# Patient Record
Sex: Female | Born: 1975 | Race: White | Hispanic: No | Marital: Married | Smoking: Never smoker
Health system: Southern US, Community
[De-identification: ages and names within clinical notes are randomized; demographics above are authoritative.]

## PROBLEM LIST (undated history)

## (undated) DIAGNOSIS — R87619 Unspecified abnormal cytological findings in specimens from cervix uteri: Secondary | ICD-10-CM

## (undated) HISTORY — DX: Unspecified abnormal cytological findings in specimens from cervix uteri: R87.619

## (undated) HISTORY — PX: NO PAST SURGERIES: SHX2092

---

## 2001-02-16 ENCOUNTER — Other Ambulatory Visit: Admission: RE | Admit: 2001-02-16 | Discharge: 2001-02-16 | Payer: Self-pay | Admitting: Family Medicine

## 2007-11-26 ENCOUNTER — Encounter: Payer: Self-pay | Admitting: Sports Medicine

## 2007-12-10 ENCOUNTER — Encounter: Payer: Self-pay | Admitting: Sports Medicine

## 2009-07-31 ENCOUNTER — Ambulatory Visit: Payer: Self-pay | Admitting: General Practice

## 2010-10-30 DIAGNOSIS — R87619 Unspecified abnormal cytological findings in specimens from cervix uteri: Secondary | ICD-10-CM

## 2010-10-30 HISTORY — DX: Unspecified abnormal cytological findings in specimens from cervix uteri: R87.619

## 2014-04-12 ENCOUNTER — Ambulatory Visit: Payer: Self-pay | Admitting: General Practice

## 2014-04-19 ENCOUNTER — Ambulatory Visit: Payer: Self-pay | Admitting: General Practice

## 2016-05-16 ENCOUNTER — Ambulatory Visit (INDEPENDENT_AMBULATORY_CARE_PROVIDER_SITE_OTHER): Payer: BLUE CROSS/BLUE SHIELD | Admitting: Certified Nurse Midwife

## 2016-05-16 ENCOUNTER — Encounter: Payer: Self-pay | Admitting: Certified Nurse Midwife

## 2016-05-16 VITALS — BP 110/70 | HR 70 | Ht 64.5 in | Wt 176.0 lb

## 2016-05-16 DIAGNOSIS — Z01419 Encounter for gynecological examination (general) (routine) without abnormal findings: Secondary | ICD-10-CM | POA: Diagnosis not present

## 2016-05-16 DIAGNOSIS — Z309 Encounter for contraceptive management, unspecified: Secondary | ICD-10-CM | POA: Insufficient documentation

## 2016-05-16 DIAGNOSIS — Z124 Encounter for screening for malignant neoplasm of cervix: Secondary | ICD-10-CM

## 2016-05-16 DIAGNOSIS — Z3041 Encounter for surveillance of contraceptive pills: Secondary | ICD-10-CM

## 2016-05-16 MED ORDER — LEVONORGESTREL-ETHINYL ESTRAD 0.15-30 MG-MCG PO TABS: 1.0000 | ORAL_TABLET | Freq: Every day | ORAL | 3 refills | Status: DC

## 2016-05-16 NOTE — Progress Notes (Addendum)
Gynecology Annual Exam  PCP: No PCP Per Patient  Chief Complaint: Annual exam  History of Present Illness: Patient is a 41 y.o. [id number] presents for annual exam. The patient has no significant gyn complaints today.  At her last annual she had an elevated blood pressure reading on Sprintec and was switched to [place]ia. Repeat blood pressure on Portia was borderline (diastolic 88).  Her menses are every month, lasting 4 days, with a light flow. She occ has back pain with her menses and takes ibuprofen with relief. Her LMP was 04/25/2016. Since her last annual exam on 02/24/2015, she has changed jobs, is more sedentary at work, and has gained 17#. The patient is sexually active. She currently uses Oral contraceptives: Portia for contraception.  Her last Pap smear was 02/24/2015 and was NIL. 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  The patient has regular exercise: yes. She does get adequate calcium in her diet  She does not do monthly self breast exams. Has not had a recent mammogram and is eligible. There is a family history of breast cancer in her paternal grandmother 50(60s). Genetic testing has not been done She drinks alcohol socially. She does not smoke.   Review of Systems: Review of Systems  Constitutional: Negative for chills, fever and weight loss.       Positive for weight gain  HENT: Negative for congestion, sinus pain and sore throat.   Eyes: Negative for blurred vision and pain.  Respiratory: Negative for hemoptysis, shortness of breath and wheezing.   Cardiovascular: Negative for chest pain, palpitations and leg swelling.  Gastrointestinal: Negative for abdominal pain, blood in stool, diarrhea, heartburn, nausea and vomiting.  Genitourinary: Negative for dysuria, frequency, hematuria and urgency.       Negative for irregular menses and menorrhagia  Musculoskeletal: Negative for back pain, joint pain and myalgias.  Skin: Negative for itching and rash.   Neurological: Negative for dizziness, tingling and headaches.  Endo/Heme/Allergies: Negative for environmental allergies and polydipsia. Does not bruise/bleed easily.       Negative for hirsutism   Psychiatric/Behavioral: Negative for depression. The patient is not nervous/anxious and does not have insomnia.      Past Medical History:  Past Medical History:  Diagnosis Date  . Abnormal Pap smear of cervix 10/30/2010   +HPV, pap on 12/10/11 neg/neg; pap 12/2012 neg/neg    Past Surgical History:  History reviewed. No pertinent surgical history.  Medications: Prior to Admission medications   Medication Sig Start Date End Date Taking? Authorizing Provider  levonorgestrel-ethinyl estradiol (NORDETTE) 0.15-30 MG-MCG tablet Take 1 tablet by mouth daily.    Historical Provider, MD    Allergies:  No Known Allergies   Obstetric History: G2P2002 vaginal deliveries x 2  Social History:  Social History   Social History  . Marital status: Married    Spouse name: N/A  . Number of children: 2  . Years of education: N/A   Occupational History  . Admin assistant     [employer]   Social History Main Topics  . Smoking status: Never Smoker  . Smokeless tobacco: Never Used  . Alcohol use Yes  . Drug use: No  . Sexual activity: Yes    Birth control/ protection: Pill   Other Topics Concern  . Not on file   Social History Narrative  . No narrative on file    Family History:  Family History  Problem Relation Age of Onset  .  Hypertension Mother   . Diabetes Father   . Hypertension Father   . Diabetes Paternal Uncle   . Heart disease Maternal Grandmother   . Stroke Maternal Grandmother   . Bladder Cancer Maternal Grandfather   . Breast cancer Paternal Grandmother 46  . Hypertension Paternal Grandmother   . Stroke Paternal Grandmother      Physical Exam Vitals:  Vitals:   05/16/16 0816  BP: 110/70  Pulse: 70    Physical Exam  Constitutional: She is oriented  to person, place, and time and well-developed, well-nourished, and in no distress.  HENT:  Head: Normocephalic.  Eyes: Conjunctivae are normal.  Neck: Normal range of motion. No thyromegaly present.  Cardiovascular: Normal rate and regular rhythm.   No murmur heard. Pulmonary/Chest: Effort normal. She has no wheezes. She has no rales. She exhibits no tenderness.  Abdominal: Soft. She exhibits no distension and no mass. There is no tenderness. There is no guarding.  Genitourinary:  Genitourinary Comments: Vulva: no lesions or inflammation Vagina: no lesions, normal rugae Cervix: no lesions Uterus: RF, NSSC, mobile, NT Adnexa: NT, no masses   Musculoskeletal: She exhibits no edema.  Lymphadenopathy:    She has no cervical adenopathy.    She has no axillary adenopathy.       Right: No inguinal adenopathy present.       Left: No inguinal adenopathy present.  Neurological: She is alert and oriented to person, place, and time. Gait normal.  Skin: Skin is warm and dry. No rash noted.  Psychiatric: Mood, affect and judgment normal.   Breast: symmetrical, soft,  no skin changes, no nipple discharge, no masses. No infraclavicular or supraclavicular LAN  Assessment: 41 y.o. well woman exam  Plan:  1) Contraception management: refill Portia  2) Schedule screening mammogram. Recommend annual screening mammogram and monthly self breast exam  3) Pap-done  -4) Routine healthcare maintenance including cholesterol, diabetes screening done 02/24/15 was WNL.  5) Follow up 1 year for routine annual exam  Farrel Conners, CNM  05/27/2016 10:34 PM

## 2016-05-18 LAB — IGP, APTIMA HPV
HPV APTIMA: NEGATIVE
PAP Smear Comment: 0

## 2016-05-22 ENCOUNTER — Other Ambulatory Visit: Payer: Self-pay | Admitting: Certified Nurse Midwife

## 2016-05-22 DIAGNOSIS — Z1231 Encounter for screening mammogram for malignant neoplasm of breast: Secondary | ICD-10-CM

## 2016-05-24 ENCOUNTER — Ambulatory Visit
Admission: RE | Admit: 2016-05-24 | Discharge: 2016-05-24 | Disposition: A | Discharge status: Home / Self Care | Payer: BLUE CROSS/BLUE SHIELD | Source: Ambulatory Visit | Attending: Certified Nurse Midwife | Admitting: Certified Nurse Midwife

## 2016-05-24 DIAGNOSIS — Z1231 Encounter for screening mammogram for malignant neoplasm of breast: Secondary | ICD-10-CM | POA: Diagnosis not present

## 2016-05-27 ENCOUNTER — Encounter: Payer: Self-pay | Admitting: Certified Nurse Midwife

## 2017-07-01 ENCOUNTER — Ambulatory Visit (INDEPENDENT_AMBULATORY_CARE_PROVIDER_SITE_OTHER): Payer: 59 | Admitting: Certified Nurse Midwife

## 2017-07-01 ENCOUNTER — Encounter: Payer: Self-pay | Admitting: Certified Nurse Midwife

## 2017-07-01 VITALS — BP 120/76 | HR 80 | Ht 64.5 in | Wt 180.0 lb

## 2017-07-01 DIAGNOSIS — Z01419 Encounter for gynecological examination (general) (routine) without abnormal findings: Secondary | ICD-10-CM

## 2017-07-01 DIAGNOSIS — Z1239 Encounter for other screening for malignant neoplasm of breast: Secondary | ICD-10-CM

## 2017-07-01 DIAGNOSIS — Z124 Encounter for screening for malignant neoplasm of cervix: Secondary | ICD-10-CM | POA: Diagnosis not present

## 2017-07-01 DIAGNOSIS — Z0000 Encounter for general adult medical examination without abnormal findings: Secondary | ICD-10-CM

## 2017-07-01 DIAGNOSIS — Z1231 Encounter for screening mammogram for malignant neoplasm of breast: Secondary | ICD-10-CM | POA: Diagnosis not present

## 2017-07-01 NOTE — Progress Notes (Signed)
Gynecology Annual Exam  PCP: Diana Contreras, No Pcp Per  Chief Complaint: Annual exam  History of Present Illness: Diana Contreras is a 42 y.o. ZO  [id number] who presents for her annual exam. The Diana Contreras has no significant gyn complaints today.   Her menses are every month, lasting 2-3 days, with a light flow. She occ has back pain with her menses and takes ibuprofen with relief. Her LMP was 06/19/2017. Since her last annual exam on 05/16/2016, she has had no significant changes in her health. The Diana Contreras is sexually active. She currently uses Oral contraceptives: Portia for contraception.  Her last Pap smear was 02/24/2015 and was NIL. 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  The Diana Contreras has regular exercise: yes. She does get adequate calcium in her diet  She does not do monthly self breast exams. Her most recent mammogram was 05/24/2016 and was negative. There is a family history of breast cancer in her paternal grandmother (46s). Genetic testing has not been done She drinks alcohol once/week. . She does not smoke. Her most recent cholesterol screen was 02/24/2015 and was normal.    Review of Systems: Review of Systems  Constitutional: Negative for chills, fever and weight loss.       Positive for weight gain  HENT: Negative for congestion, sinus pain and sore throat.   Eyes: Negative for blurred vision and pain.  Respiratory: Negative for hemoptysis, shortness of breath and wheezing.   Cardiovascular: Negative for chest pain, palpitations and leg swelling.  Gastrointestinal: Negative for abdominal pain, blood in stool, diarrhea, heartburn, nausea and vomiting.  Genitourinary: Negative for dysuria, frequency, hematuria and urgency.       Negative for irregular menses and menorrhagia  Musculoskeletal: Negative for back pain, joint pain and myalgias.  Skin: Negative for itching and rash.  Neurological: Negative for dizziness, tingling and headaches.  Endo/Heme/Allergies:  Negative for environmental allergies and polydipsia. Does not bruise/bleed easily.       Negative for hirsutism   Psychiatric/Behavioral: Negative for depression. The Diana Contreras is not nervous/anxious and does not have insomnia.      Past Medical History:  Past Medical History:  Diagnosis Date  . Abnormal Pap smear of cervix 10/30/2010   +HPV, pap on 12/10/11 neg/neg; pap 12/2012 neg/neg    Past Surgical History:  Past Surgical History:  Procedure Laterality Date  . NO PAST SURGERIES      Medications: Prior to Admission medications   Medication Sig Start Date End Date Taking? Authorizing Provider  levonorgestrel-ethinyl estradiol (NORDETTE) 0.15-30 MG-MCG tablet Take 1 tablet by mouth daily.    Historical Provider, MD    Allergies:  No Known Allergies   Obstetric History: G2P2002 vaginal deliveries x 2  Social History:  Social History   Socioeconomic History  . Marital status: Married    Spouse name: Not on file  . Number of children: 2  . Years of education: Not on file  . Highest education level: Not on file  Occupational History  . Occupation: Advertising account executive    Comment: [employer]  Social Needs  . Financial resource strain: Not on file  . Food insecurity:    Worry: Not on file    Inability: Not on file  . Transportation needs:    Medical: Not on file    Non-medical: Not on file  Tobacco Use  . Smoking status: Never Smoker  . Smokeless tobacco: Never Used  Substance and Sexual Activity  . Alcohol  use: Yes  . Drug use: No  . Sexual activity: Yes    Birth control/protection: Pill  Lifestyle  . Physical activity:    Days per week: 0 days    Minutes per session: 0 min  . Stress: Not at all  Relationships  . Social connections:    Talks on phone: Not on file    Gets together: Not on file    Attends religious service: Not on file    Active member of club or organization: Not on file    Attends meetings of clubs or organizations: Not on file     Relationship status: Not on file  . Intimate partner violence:    Fear of current or ex partner: Not on file    Emotionally abused: Not on file    Physically abused: Not on file    Forced sexual activity: Not on file  Other Topics Concern  . Not on file  Social History Narrative  . Not on file    Family History:  Family History  Problem Relation Age of Onset  . Hypertension Mother   . Diabetes Father   . Hypertension Father   . Diabetes Paternal Uncle   . Heart disease Maternal Grandmother   . Stroke Maternal Grandmother   . Bladder Cancer Maternal Grandfather   . Breast cancer Paternal Grandmother [id number]  . Hypertension Paternal Grandmother   . Stroke Paternal Grandmother      Physical Exam Vitals: BP 120/76   Pulse 80   Ht 5' 4.5" (1.638 m)   Wt 180 lb (81.6 kg)   LMP 06/19/2017 (Exact Date)   BMI 30.42 kg/m  Physical Exam  Constitutional: She is oriented to person, place, and time and well-developed, well-nourished, and in no distress.  HENT:  Head: Normocephalic.  Eyes: Conjunctivae are normal.  Neck: Normal range of motion. No thyromegaly present.  Cardiovascular: Normal rate and regular rhythm.  No murmur heard. Pulmonary/Chest: Effort normal. She has no wheezes. She has no rales. She exhibits no tenderness.  Abdominal: Soft. She exhibits no distension and no mass. There is no tenderness. There is no guarding.  Genitourinary:  Genitourinary Comments: Vulva: no lesions or inflammation Vagina: no lesions, normal rugae Cervix: no lesions Uterus: RF, NSSC, mobile, NT Adnexa: NT, no masses   Musculoskeletal: She exhibits no edema.  Lymphadenopathy:    She has no cervical adenopathy.    She has no axillary adenopathy.       Right: No inguinal adenopathy present.       Left: No inguinal adenopathy present.  Neurological: She is alert and oriented to person, place, and time. Gait normal.  Skin: Skin is warm and dry. No rash noted.  Psychiatric: Mood, affect and  judgment normal.   Breast: symmetrical, soft,  no skin changes, no nipple discharge, no masses. No infraclavicular or supraclavicular LAN  Assessment: 42 y.o. well woman exam  Plan:  1) Contraception management: refill Portia  2) Schedule screening mammogram. Recommend annual screening mammogram and monthly self breast exam  3) Pap-done  -4) Routine healthcare maintenance including cholesterol, diabetes screening done 02/24/15 was WNL.  5) Follow up 1 year for routine annual exam  Farrel ConnersColleen Gayla Benn, CNM  07/03/2017 4:01 PM

## 2017-07-03 ENCOUNTER — Encounter: Payer: Self-pay | Admitting: Certified Nurse Midwife

## 2017-07-03 LAB — IGP,RFX APTIMA HPV ALL PTH: PAP Smear Comment: 0

## 2017-07-03 MED ORDER — LEVONORGESTREL-ETHINYL ESTRAD 0.15-30 MG-MCG PO TABS: 1.0000 | ORAL_TABLET | Freq: Every day | ORAL | 3 refills | Status: DC

## 2017-08-15 ENCOUNTER — Ambulatory Visit
Admission: RE | Admit: 2017-08-15 | Discharge: 2017-08-15 | Disposition: A | Discharge status: Home / Self Care | Payer: 59 | Source: Ambulatory Visit | Attending: Certified Nurse Midwife | Admitting: Certified Nurse Midwife

## 2017-08-15 DIAGNOSIS — Z1231 Encounter for screening mammogram for malignant neoplasm of breast: Secondary | ICD-10-CM | POA: Insufficient documentation

## 2017-08-15 DIAGNOSIS — Z1239 Encounter for other screening for malignant neoplasm of breast: Secondary | ICD-10-CM

## 2017-09-16 ENCOUNTER — Encounter: Payer: Self-pay | Admitting: Certified Nurse Midwife

## 2017-09-16 ENCOUNTER — Ambulatory Visit (INDEPENDENT_AMBULATORY_CARE_PROVIDER_SITE_OTHER): Payer: 59 | Admitting: Certified Nurse Midwife

## 2017-09-16 VITALS — BP 134/80 | HR 72 | Ht 64.0 in | Wt 179.0 lb

## 2017-09-16 DIAGNOSIS — N764 Abscess of vulva: Secondary | ICD-10-CM

## 2017-09-16 MED ORDER — SULFAMETHOXAZOLE-TRIMETHOPRIM 800-160 MG PO TABS: 1.0000 | ORAL_TABLET | Freq: Two times a day (BID) | ORAL | 0 refills | Status: AC

## 2017-09-16 NOTE — Progress Notes (Signed)
Obstetrics & Gynecology Office Visit   Chief Complaint:  Chief Complaint  Patient presents with  . Gynecologic Exam    found lump vaginal area    History of Present Illness: 42 year old WF, G2 P2, LMP 09/10/2017,  presents with complaints of a tender, marble sized lump on her left lower labia x 1 day. She started sitz baths with Epsom salts and it started feeling a little better today. Did not see any drainage on her underwear/ panty liner. No unusual discharge. Just recently had her annual exam in March 2019. She currently uses Estate agentortia for contraception   Review of Systems:  ROS   Past Medical History:  Past Medical History:  Diagnosis Date  . Abnormal Pap smear of cervix 10/30/2010   +HPV, pap on 12/10/11 neg/neg; pap 12/2012 neg/neg    Past Surgical History:  Past Surgical History:  Procedure Laterality Date  . NO PAST SURGERIES      Gynecologic History: Patient's last menstrual period was 09/10/2017.  Obstetric History: [id number]  Family History:  Family History  Problem Relation Age of Onset  . Hypertension Mother   . Diabetes Father   . Hypertension Father   . Diabetes Paternal Uncle   . Heart disease Maternal Grandmother        died from MI  . Stroke Maternal Grandmother   . Bladder Cancer Maternal Grandfather   . Breast cancer Paternal Grandmother [id number]  . Hypertension Paternal Grandmother   . Stroke Paternal Grandmother     Social History:  Social History   Socioeconomic History  . Marital status: Married    Spouse name: Not on file  . Number of children: 2  . Years of education: Not on file  . Highest education level: Not on file  Occupational History  . Occupation: Advertising account executiveAdmin assistant    Comment: [employer]  Social Needs  . Financial resource strain: Not on file  . Food insecurity:    Worry: Not on file    Inability: Not on file  . Transportation needs:    Medical: Not on file    Non-medical: Not on file  Tobacco Use  . Smoking status:  Never Smoker  . Smokeless tobacco: Never Used  Substance and Sexual Activity  . Alcohol use: Yes  . Drug use: No  . Sexual activity: Yes    Birth control/protection: Pill  Lifestyle  . Physical activity:    Days per week: 0 days    Minutes per session: 0 min  . Stress: Not at all  Relationships  . Social connections:    Talks on phone: Not on file    Gets together: Not on file    Attends religious service: Not on file    Active member of club or organization: Not on file    Attends meetings of clubs or organizations: Not on file    Relationship status: Not on file  . Intimate partner violence:    Fear of current or ex partner: Not on file    Emotionally abused: Not on file    Physically abused: Not on file    Forced sexual activity: Not on file  Other Topics Concern  . Not on file  Social History Narrative  . Not on file    Allergies:  No Known Allergies  Medications: Prior to Admission medications   Medication Sig Start Date End Date Taking? Authorizing Provider  acyclovir (ZOVIRAX) 400 MG tablet Take 400 mg by mouth 3 (three) times  daily. 07/29/17  Yes [provider]  levonorgestrel-ethinyl estradiol (NORDETTE) 0.15-30 MG-MCG tablet Take 1 tablet by mouth daily. 07/03/17  Yes Farrel Conners, CNM           Physical Exam Vitals: BP 134/80   Pulse 72   Ht 5\' 4"  (1.626 m)   Wt 179 lb (81.2 kg)   LMP 09/10/2017   BMI 30.73 kg/m  Physical Exam  Constitutional: She is oriented to person, place, and time. She appears well-developed and well-nourished. No distress.  Cardiovascular: Normal rate.  Respiratory: Effort normal.  Genitourinary:  Genitourinary Comments: Vulva: on lower left labia majora is a small inflamed maculopapular lesion with a central puncture wound. Scant clear discharge expressed from area. Shortened hairs in the area  Neurological: She is alert and oriented to person, place, and time.  Psychiatric: She has a normal mood and affect.    No inguinal lymphadenopathy   Assessment: 42 y.o. [id number] with a resolving boil  Plan: Continue sitz baths BID Bactrim DS BID x 5 days RTO prn persistent or worsening symptoms.  Farrel Conners, CNM

## 2017-10-13 ENCOUNTER — Encounter: Payer: Self-pay | Admitting: Emergency Medicine

## 2017-10-13 ENCOUNTER — Ambulatory Visit
Admission: EM | Admit: 2017-10-13 | Discharge: 2017-10-13 | Disposition: A | Discharge status: Home / Self Care | Payer: 59 | Attending: Family Medicine | Admitting: Family Medicine

## 2017-10-13 ENCOUNTER — Other Ambulatory Visit: Payer: Self-pay

## 2017-10-13 DIAGNOSIS — H6501 Acute serous otitis media, right ear: Secondary | ICD-10-CM | POA: Diagnosis not present

## 2017-10-13 MED ORDER — AMOXICILLIN 875 MG PO TABS: 875.0000 mg | ORAL_TABLET | Freq: Two times a day (BID) | ORAL | 0 refills | Status: DC

## 2017-10-13 NOTE — ED Triage Notes (Signed)
Patient c/o ear drainage to right ear x 2 weeks. Also reports ear fullness.

## 2017-10-13 NOTE — ED Provider Notes (Signed)
[facility]    CSN: [id number] Arrival date & time: 10/13/17  0800     History   Chief Complaint Chief Complaint  Patient presents with  . Ear Drainage    HPI Diana Contreras is a 42 y.o. female.   42 yo female with a c/o right ear pressure for 2 weeks. Denies any fevers, chills, injuries.   The history is provided by the patient.  Ear Drainage     Past Medical History:  Diagnosis Date  . Abnormal Pap smear of cervix 10/30/2010   +HPV, pap on 12/10/11 neg/neg; pap 12/2012 neg/neg    Patient Active Problem List   Diagnosis Date Noted  . Contraceptive management 05/16/2016    Past Surgical History:  Procedure Laterality Date  . NO PAST SURGERIES      OB History    Gravida  2   Para  2   Term  2   Preterm      AB      Living  2     SAB      TAB      Ectopic      Multiple      Live Births  2        Obstetric Comments  Pregnancy induced hypertension         Home Medications    Prior to Admission medications   Medication Sig Start Date End Date Taking? Authorizing Provider  acyclovir (ZOVIRAX) 400 MG tablet Take 400 mg by mouth 3 (three) times daily. 07/29/17  Yes [provider]  levonorgestrel-ethinyl estradiol (NORDETTE) 0.15-30 MG-MCG tablet Take 1 tablet by mouth daily. 07/03/17  Yes Farrel ConnersGutierrez, Colleen, CNM  amoxicillin (AMOXIL) 875 MG tablet Take 1 tablet (875 mg total) by mouth 2 (two) times daily. 10/13/17   Payton Mccallumonty, Evonda Enge, MD    Family History Family History  Problem Relation Age of Onset  . Hypertension Mother   . Diabetes Father   . Hypertension Father   . Diabetes Paternal Uncle   . Heart disease Maternal Grandmother        died from MI  . Stroke Maternal Grandmother   . Bladder Cancer Maternal Grandfather   . Breast cancer Paternal Grandmother [id number]  . Hypertension Paternal Grandmother   . Stroke Paternal Grandmother     Social History Social History   Tobacco Use  . Smoking status: Never Smoker   . Smokeless tobacco: Never Used  Substance Use Topics  . Alcohol use: Yes  . Drug use: No     Allergies   Patient has no known allergies.   Review of Systems Review of Systems   Physical Exam Triage Vital Signs ED Triage Vitals  Enc Vitals Group     BP 10/13/17 0812 (!) 141/106     Pulse Rate 10/13/17 0812 77     Resp 10/13/17 0812 18     Temp 10/13/17 0812 98.6 F (37 C)     Temp Source 10/13/17 0812 Oral     SpO2 10/13/17 0812 98 %     Weight 10/13/17 0810 175 lb (79.4 kg)     Height 10/13/17 0810 5\' 4"  (1.626 m)     Head Circumference --      Peak Flow --      Pain Score 10/13/17 0810 0     Pain Loc --      Pain Edu? --      Excl. in GC? --    No data found.  Updated Vital Signs BP (!) 141/106 (BP Location: Left Arm)   Pulse 77   Temp 98.6 F (37 C) (Oral)   Resp 18   Ht 5\' 4"  (1.626 m)   Wt 175 lb (79.4 kg)   LMP 10/10/2017 (Exact Date)   SpO2 98%   BMI 30.04 kg/m   Visual Acuity Right Eye Distance:   Left Eye Distance:   Bilateral Distance:    Right Eye Near:   Left Eye Near:    Bilateral Near:     Physical Exam  Constitutional: She appears well-developed and well-nourished. No distress.  HENT:  Right Ear: Tympanic membrane is erythematous and bulging. A middle ear effusion is present.  Left Ear: Tympanic membrane and ear canal normal.  Skin: She is not diaphoretic.  Nursing note and vitals reviewed.    UC Treatments / Results  Labs (all labs ordered are listed, but only abnormal results are displayed) Labs Reviewed - No data to display  EKG None  Radiology No results found.  Procedures Procedures (including critical care time)  Medications Ordered in UC Medications - No data to display  Initial Impression / Assessment and Plan / UC Course  I have reviewed the triage vital signs and the nursing notes.  Pertinent labs & imaging results that were available during my care of the patient were reviewed by me and considered in  my medical decision making (see chart for details).      Final Clinical Impressions(s) / UC Diagnoses   Final diagnoses:  Right acute serous otitis media, recurrence not specified    ED Prescriptions    Medication Sig Dispense Auth. Provider   amoxicillin (AMOXIL) 875 MG tablet Take 1 tablet (875 mg total) by mouth 2 (two) times daily. 20 tablet Payton Mccallum, MD     1. diagnosis reviewed with patient 2. rx as per orders above; reviewed possible side effects, interactions, risks and benefits  3. Recommend supportive treatment with otc analgesics prn 4. Follow-up prn if symptoms worsen or don't improve   Controlled Substance Prescriptions [place] Controlled Substance Registry consulted? Not Applicable   Payton Mccallum, MD 10/13/17 [phone]

## 2018-08-10 ENCOUNTER — Other Ambulatory Visit: Payer: Self-pay | Admitting: Certified Nurse Midwife

## 2018-08-17 ENCOUNTER — Other Ambulatory Visit: Payer: Self-pay | Admitting: Certified Nurse Midwife

## 2018-09-22 ENCOUNTER — Ambulatory Visit (INDEPENDENT_AMBULATORY_CARE_PROVIDER_SITE_OTHER): Payer: 59 | Admitting: Certified Nurse Midwife

## 2018-09-22 ENCOUNTER — Other Ambulatory Visit: Payer: Self-pay

## 2018-09-22 ENCOUNTER — Encounter: Payer: Self-pay | Admitting: Certified Nurse Midwife

## 2018-09-22 VITALS — BP 120/80 | Ht 64.0 in | Wt 191.0 lb

## 2018-09-22 DIAGNOSIS — R6889 Other general symptoms and signs: Secondary | ICD-10-CM

## 2018-09-22 DIAGNOSIS — Z01419 Encounter for gynecological examination (general) (routine) without abnormal findings: Secondary | ICD-10-CM | POA: Diagnosis not present

## 2018-09-22 DIAGNOSIS — Z13220 Encounter for screening for lipoid disorders: Secondary | ICD-10-CM

## 2018-09-22 DIAGNOSIS — Z131 Encounter for screening for diabetes mellitus: Secondary | ICD-10-CM

## 2018-09-22 DIAGNOSIS — R635 Abnormal weight gain: Secondary | ICD-10-CM

## 2018-09-22 DIAGNOSIS — Z1239 Encounter for other screening for malignant neoplasm of breast: Secondary | ICD-10-CM

## 2018-09-22 NOTE — Progress Notes (Signed)
Gynecology Annual Exam     PCP: Dalia Heading, CNM  Chief Complaint: Annual exam  History of Present Illness: Patient is a 43 y.o. WF  G2P2002 who presents for her annual exam. The patient has no significant gyn complaints today.   Her menses are every month, lasting 2-3 days, with a light flow. She occ has back pain with her menses and takes ibuprofen with relief. Her LMP was 09/16/2018. Since her last annual exam on 07/01/2017, she has gained 11#. Is having trouble losing weight. 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 Feels hot all the time. Would like thyroid function checked.  The patient is sexually active. She currently uses Oral contraceptives: Portia for contraception.  Her last Pap smear was 07/01/2017 and was NIL. 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  The patient has regular exercise: yes.by walking 2-4 times/week. She does get adequate calcium in her diet  She does not do monthly self breast exams. Her most recent mammogram was 08/15/2017 and was negative. There is a family history of breast cancer in her paternal grandmother (62s). Genetic testing has not been done She drinks a glass of 1-2 time//week. . She does not smoke. Her most recent cholesterol screen was 02/24/2015 and was normal.    Review of Systems: Review of Systems  Constitutional: Negative for chills, fever and weight loss.       Positive for weight gain  HENT: Negative for congestion, sinus pain and sore throat.   Eyes: Negative for blurred vision and pain.  Respiratory: Negative for hemoptysis, shortness of breath and wheezing.   Cardiovascular: Negative for chest pain, palpitations and leg swelling.  Gastrointestinal: Negative for abdominal pain, blood in stool, diarrhea, heartburn, nausea and vomiting.  Genitourinary: Negative for dysuria, frequency, hematuria and urgency.       Negative for irregular menses and menorrhagia  Musculoskeletal: Negative for back  pain, joint pain and myalgias.  Skin: Negative for itching and rash.  Neurological: Negative for dizziness, tingling and headaches.  Endo/Heme/Allergies: Negative for environmental allergies and polydipsia. Does not bruise/bleed easily.       Negative for hirsutism   Psychiatric/Behavioral: Negative for depression. The patient is not nervous/anxious and does not have insomnia.      Past Medical History:  Past Medical History:  Diagnosis Date  . Abnormal Pap smear of cervix 10/30/2010   +HPV, pap on 12/10/11 neg/neg; pap 12/2012 neg/neg    Past Surgical History:  Past Surgical History:  Procedure Laterality Date  . NO PAST SURGERIES      Medications:  Current Outpatient Medications:  .  acyclovir (ZOVIRAX) 400 MG tablet, Take 400 mg by mouth 3 (three) times daily. X 3-5 days Prn fever blisters, Disp: , Rfl:  .  LILLOW 0.15-30 MG-MCG tablet, TAKE 1 TABLET BY MOUTH EVERY DAY, Disp: 84 tablet, Rfl: 0  Allergies:  No Known Allergies   Obstetric History: G2P2002 vaginal deliveries x 2  Social History:  Social History   Socioeconomic History  . Marital status: Married    Spouse name: Not on file  . Number of children: 2  . Years of education: Not on file  . Highest education level: Not on file  Occupational History  . Occupation: Chiropractor    Comment: [employer]  Social Needs  . Financial resource strain: Not on file  . Food insecurity    Worry: Not on file    Inability:  Not on file  . Transportation needs    Medical: Not on file    Non-medical: Not on file  Tobacco Use  . Smoking status: Never Smoker  . Smokeless tobacco: Never Used  Substance and Sexual Activity  . Alcohol use: Yes  . Drug use: No  . Sexual activity: Yes    Birth control/protection: Pill  Lifestyle  . Physical activity    Days per week: 0 days    Minutes per session: 0 min  . Stress: Not at all  Relationships  . Social Musicianconnections    Talks on phone: Not on file    Gets  together: Not on file    Attends religious service: Not on file    Active member of club or organization: Not on file    Attends meetings of clubs or organizations: Not on file    Relationship status: Not on file  . Intimate partner violence    Fear of current or ex partner: Not on file    Emotionally abused: Not on file    Physically abused: Not on file    Forced sexual activity: Not on file  Other Topics Concern  . Not on file  Social History Narrative  . Not on file    Family History:  Family History  Problem Relation Age of Onset  . Hypertension Mother   . Diabetes Father   . Hypertension Father   . Diabetes Paternal Uncle   . Heart disease Maternal Grandmother        died from MI  . Stroke Maternal Grandmother   . Bladder Cancer Maternal Grandfather   . Breast cancer Paternal Grandmother [id number]  . Hypertension Paternal Grandmother   . Stroke Paternal Grandmother      Physical Exam Vitals: BP 120/80   Ht 5\' 4"  (1.626 m)   Wt 191 lb (86.6 kg)   LMP 09/16/2018   BMI 32.79 kg/m  Physical Exam  Constitutional: She is oriented to person, place, and time and well-developed, well-nourished, and in no distress.  HENT:  Head: Normocephalic.  Eyes: Conjunctivae are normal.  Neck: Normal range of motion. No thyromegaly present.  Cardiovascular: Normal rate and regular rhythm.  No murmur heard. Pulmonary/Chest: Effort normal. She has no wheezes. She has no rales. She exhibits no tenderness.  Abdominal: Soft. She exhibits no distension and no mass. There is no abdominal tenderness. There is no guarding.  Genitourinary:    Genitourinary Comments: Vulva: no lesions or inflammation Vagina: no lesions, normal rugae Cervix: no lesions Uterus: RF, NSSC, mobile, NT Adnexa: NT, no masses    Musculoskeletal:        General: No edema.  Lymphadenopathy:    She has no cervical adenopathy.    She has no axillary adenopathy.  Neurological: She is alert and oriented to person,  place, and time. Gait normal.  Skin: Skin is warm and dry. No rash noted.  Psychiatric: Mood, affect and judgment normal.   Breast: symmetrical, soft,  no skin changes, no nipple discharge, no masses. No infraclavicular or supraclavicular LAN  Assessment: 43 y.o. well woman exam  Plan:  1) Contraception management: refill Portia  2) Breast cancer screening: Schedule screening mammogram. Recommend annual screening mammogram and monthly self breast exam  3) Pap-done  -4) Routine healthcare maintenance including cholesterol, diabetes screening and TSH done today. Discussed calcium and vitamin D3 requirements. Encouraged stepping up exercise as well as eating healthy to decrease weight.   5) Follow up 1 year for  routine annual exam  Farrel Connersolleen Gaetano Romberger, CNM

## 2018-09-23 ENCOUNTER — Telehealth: Payer: Self-pay

## 2018-09-23 ENCOUNTER — Other Ambulatory Visit: Payer: Self-pay | Admitting: Maternal Newborn

## 2018-09-23 DIAGNOSIS — B001 Herpesviral vesicular dermatitis: Secondary | ICD-10-CM

## 2018-09-23 LAB — LIPID PANEL
Chol/HDL Ratio: 2.4 ratio (ref 0.0–4.4)
Cholesterol, Total: 175 mg/dL (ref 100–199)
HDL: 73 mg/dL (ref >39)
LDL Calculated: 82 mg/dL (ref 0–99)
Triglycerides: 98 mg/dL (ref 0–149)
VLDL Cholesterol Cal: 20 mg/dL (ref 5–40)

## 2018-09-23 LAB — TSH: TSH: 0.868 u[IU]/mL (ref 0.450–4.500)

## 2018-09-23 LAB — HEMOGLOBIN A1C
Est. average glucose Bld gHb Est-mCnc: 108 mg/dL
Hgb A1c MFr Bld: 5.4 % (ref 4.8–5.6)

## 2018-09-23 MED ORDER — ACYCLOVIR 400 MG PO TABS: 400.0000 mg | ORAL_TABLET | Freq: Three times a day (TID) | ORAL | 1 refills | Status: AC

## 2018-09-23 NOTE — Telephone Encounter (Signed)
Sent to pharmacy just now.

## 2018-09-23 NOTE — Progress Notes (Signed)
Refill for acyclovir at patient's request.

## 2018-09-23 NOTE — Telephone Encounter (Signed)
Pt aware.

## 2018-09-23 NOTE — Telephone Encounter (Signed)
Pt calling; was seen by CLG yesterday; refill of acyclovir was supposed to have been sent in; hsb went to p/u at [facility] on [address]; was told 'it wasn't available'.  Pt has fever blisters and needs this rx.  [phone]

## 2018-10-04 ENCOUNTER — Encounter: Payer: Self-pay | Admitting: Certified Nurse Midwife

## 2018-10-04 MED ORDER — LEVONORGESTREL-ETHINYL ESTRAD 0.15-30 MG-MCG PO TABS: 1.0000 | ORAL_TABLET | Freq: Every day | ORAL | 3 refills | Status: DC

## 2018-10-08 ENCOUNTER — Encounter: Payer: Self-pay | Admitting: Certified Nurse Midwife

## 2018-10-29 ENCOUNTER — Other Ambulatory Visit: Payer: Self-pay

## 2018-10-29 ENCOUNTER — Ambulatory Visit
Admission: RE | Admit: 2018-10-29 | Discharge: 2018-10-29 | Disposition: A | Discharge status: Home / Self Care | Payer: 59 | Source: Ambulatory Visit | Attending: Certified Nurse Midwife | Admitting: Certified Nurse Midwife

## 2018-10-29 DIAGNOSIS — Z1231 Encounter for screening mammogram for malignant neoplasm of breast: Secondary | ICD-10-CM | POA: Insufficient documentation

## 2018-10-29 DIAGNOSIS — Z1239 Encounter for other screening for malignant neoplasm of breast: Secondary | ICD-10-CM

## 2019-07-16 ENCOUNTER — Ambulatory Visit: Payer: 59 | Attending: Internal Medicine

## 2019-07-16 DIAGNOSIS — Z20822 Contact with and (suspected) exposure to covid-19: Secondary | ICD-10-CM

## 2019-07-17 LAB — NOVEL CORONAVIRUS, NAA: SARS-CoV-2, NAA: NOT DETECTED

## 2019-07-17 LAB — SARS-COV-2, NAA 2 DAY TAT

## 2019-11-08 ENCOUNTER — Telehealth: Payer: Self-pay

## 2019-11-08 ENCOUNTER — Other Ambulatory Visit: Payer: Self-pay

## 2019-11-08 MED ORDER — LEVONORGESTREL-ETHINYL ESTRAD 0.15-30 MG-MCG PO TABS: 1.0000 | ORAL_TABLET | Freq: Every day | ORAL | 3 refills | Status: DC

## 2019-12-14 ENCOUNTER — Other Ambulatory Visit (HOSPITAL_COMMUNITY)
Admission: RE | Admit: 2019-12-14 | Discharge: 2019-12-14 | Disposition: A | Discharge status: Home / Self Care | Payer: 59 | Source: Ambulatory Visit | Attending: Obstetrics | Admitting: Obstetrics

## 2019-12-14 ENCOUNTER — Other Ambulatory Visit: Payer: Self-pay

## 2019-12-14 ENCOUNTER — Encounter: Payer: Self-pay | Admitting: Obstetrics

## 2019-12-14 ENCOUNTER — Ambulatory Visit (INDEPENDENT_AMBULATORY_CARE_PROVIDER_SITE_OTHER): Payer: 59 | Admitting: Obstetrics

## 2019-12-14 VITALS — BP 130/90 | HR 66 | Ht 65.0 in | Wt 189.0 lb

## 2019-12-14 DIAGNOSIS — Z01419 Encounter for gynecological examination (general) (routine) without abnormal findings: Secondary | ICD-10-CM | POA: Diagnosis present

## 2019-12-14 DIAGNOSIS — Z23 Encounter for immunization: Secondary | ICD-10-CM

## 2019-12-14 MED ORDER — LEVONORGESTREL-ETHINYL ESTRAD 0.15-30 MG-MCG PO TABS: 1.0000 | ORAL_TABLET | Freq: Every day | ORAL | 3 refills | Status: DC

## 2019-12-14 NOTE — Progress Notes (Signed)
Gynecology Annual Exam  PCP: Pcp, No  Chief Complaint:  Chief Complaint  Patient presents with  . Gynecologic Exam    needs refill of acyclovir    History of Present Illness: Patient is a 44 y.o. [id number] presents for annual exam. The patient has no complaints today.   LMP: Patient's last menstrual period was 12/09/2019 (exact date). Average Interval: regular, 28 days Duration of flow: 4 days Heavy Menses: no Clots: no Intermenstrual Bleeding: no Postcoital Bleeding: no Dysmenorrhea: no   The patient is sexually active. She currently uses OCP (estrogen/progesterone) for contraception. She denies dyspareuHer paternal grandmother had breast CA at age 42.  The patient wears seatbelts: yes.   The patient has regular exercise: yes.    The patient denies current symptoms of depression.    Review of Systems: ROS  Past Medical History:  Patient Active Problem List   Diagnosis Date Noted  . Well woman exam with routine gynecological exam 05/16/2016    Past Surgical History:  Past Surgical History:  Procedure Laterality Date  . NO PAST SURGERIES      Gynecologic History:  Patient's last menstrual period was 12/09/2019 (exact date). Contraception: OCP (estrogen/progesterone) Last Pap: Results were: 2019 no abnormalities  Last mammogram: last year Results were: normal findings, negative for abnormal findings  Obstetric History: [id number]  Family History:  Family History  Problem Relation Age of Onset  . Hypertension Mother   . Diabetes Father   . Hypertension Father   . Diabetes Paternal Uncle   . Heart disease Maternal Grandmother        died from MI  . Stroke Maternal Grandmother   . Bladder Cancer Maternal Grandfather   . Breast cancer Paternal Grandmother 22  . Hypertension Paternal Grandmother   . Stroke Paternal Grandmother     Social History:  Social History   Socioeconomic History  . Marital status: Married    Spouse name: Not on file  . Number  of children: 2  . Years of education: Not on file  . Highest education level: Not on file  Occupational History  . Occupation: Automotive engineer    Comment: [employer]  Tobacco Use  . Smoking status: Never Smoker  . Smokeless tobacco: Never Used  Vaping Use  . Vaping Use: Never used  Substance and Sexual Activity  . Alcohol use: Yes  . Drug use: No  . Sexual activity: Yes    Birth control/protection: Pill  Other Topics Concern  . Not on file  Social History Narrative  . Not on file   Social Determinants of Health   Financial Resource Strain:   . Difficulty of Paying Living Expenses: Not on file  Food Insecurity:   . Worried About Programme researcher, broadcasting/film/video in the Last Year: Not on file  . Ran Out of Food in the Last Year: Not on file  Transportation Needs:   . Lack of Transportation (Medical): Not on file  . Lack of Transportation (Non-Medical): Not on file  Physical Activity:   . Days of Exercise per Week: Not on file  . Minutes of Exercise per Session: Not on file  Stress:   . Feeling of Stress : Not on file  Social Connections:   . Frequency of Communication with Friends and Family: Not on file  . Frequency of Social Gatherings with Friends and Family: Not on file  . Attends Religious Services: Not on file  . Active Member of Clubs or Organizations: Not on  file  . Attends Banker Meetings: Not on file  . Marital Status: Not on file  Intimate Partner Violence:   . Fear of Current or Ex-Partner: Not on file  . Emotionally Abused: Not on file  . Physically Abused: Not on file  . Sexually Abused: Not on file    Allergies:  No Known Allergies  Medications: Prior to Admission medications   Medication Sig Start Date End Date Taking? Authorizing Provider  acyclovir (ZOVIRAX) 400 MG tablet Take 400 mg by mouth 3 (three) times daily. X 3-5 days Prn fever blisters   Yes [provider]  levonorgestrel-ethinyl estradiol (LILLOW) 0.15-30 MG-MCG  tablet Take 1 tablet by mouth daily. 11/08/19  Yes Farrel Conners, CNM    Physical Exam Vitals: Blood pressure 130/90, pulse 66, height 5\' 5"  (1.651 m), weight 189 lb (85.7 kg), last menstrual period 12/09/2019.  General: NAD HEENT: normocephalic, anicteric Thyroid: no enlargement, no palpable nodules Pulmonary: No increased work of breathing, CTAB Cardiovascular: RRR, distal pulses 2+ Breast: Breast symmetrical, no tenderness, no palpable nodules or masses, no skin or nipple retraction present, no nipple discharge.  No axillary or supraclavicular lymphadenopathy. Abdomen: NABS, soft, non-tender, non-distended.  Umbilicus without lesions.  No hepatomegaly, splenomegaly or masses palpable. No evidence of hernia  Genitourinary:  External: Normal external female genitalia.  Normal urethral meatus, normal Bartholin's and Skene's glands.    Vagina: Normal vaginal mucosa, no evidence of prolapse.    Cervix: Grossly normal in appearance, no bleeding  Uterus: Non-enlarged, mobile, normal contour.  No CMT  Adnexa: ovaries non-enlarged, no adnexal masses  Rectal: deferred  Lymphatic: no evidence of inguinal lymphadenopathy Extremities: no edema, erythema, or tenderness Neurologic: Grossly intact Psychiatric: mood appropriate, affect full  Female chaperone present for pelvic and breast  portions of the physical exam    Assessment: 44 y.o. G2P2002 routine annual exam  Plan: Problem List Items Addressed This Visit    None    Visit Diagnoses    Need for diphtheria-tetanus-pertussis (Tdap) vaccine    -  Primary   Relevant Orders   Tdap vaccine greater than or equal to 7yo IM (Completed)   Women's annual routine gynecological examination       Relevant Orders   Cytology - PAP   CBC With Differential   Comprehensive metabolic panel   TSH   25-Hydroxy vitamin D Lcms D2+D3      1) Mammogram - recommend yearly screening mammogram.  Mammogram Was ordered today   2) STI screening   wasoffered and declined  3) ASCCP guidelines and rational discussed.  Patient opts for every 3 years screening interval  4) Contraception - the patient is currently using  OCP (estrogen/progesterone).  She is happy with her current form of contraception and plans to continue  5) Colonoscopy -- Screening recommended starting at age 73 for average risk individuals, age 60 for individuals deemed at increased risk (including African Americans) and recommended to continue until age 38.  For patient age 86-85 individualized approach is recommended.  Gold standard screening is via colonoscopy, Cologuard screening is an acceptable alternative for patient unwilling or unable to undergo colonoscopy.  "Colorectal cancer screening for average?risk adults: 2018 guideline update from the American Cancer Society"CA: A Cancer Journal for Clinicians: Aug 07, 2016   6) Routine healthcare maintenance including cholesterol, diabetes screening discussed Ordered today  7) Return in about 1 year (around 12/13/2020) for annual.   She received a tdap today.    02/12/2021,  CNM  12/14/2019 5:35 PM   [facility], [facility] 12/14/2019, 5:33 PM

## 2019-12-16 LAB — CYTOLOGY - PAP
Comment: NEGATIVE
Diagnosis: NEGATIVE
High risk HPV: NEGATIVE

## 2019-12-17 ENCOUNTER — Encounter: Payer: Self-pay | Admitting: Obstetrics

## 2019-12-22 LAB — COMPREHENSIVE METABOLIC PANEL
ALT: 17 IU/L (ref 0–32)
AST: 16 IU/L (ref 0–40)
Albumin/Globulin Ratio: 1.4 (ref 1.2–2.2)
Albumin: 4.1 g/dL (ref 3.8–4.8)
Alkaline Phosphatase: 100 IU/L (ref 44–121)
BUN/Creatinine Ratio: 13 (ref 9–23)
BUN: 12 mg/dL (ref 6–24)
Bilirubin Total: 0.2 mg/dL (ref 0.0–1.2)
CO2: 26 mmol/L (ref 20–29)
Calcium: 9.2 mg/dL (ref 8.7–10.2)
Chloride: 103 mmol/L (ref 96–106)
Creatinine, Ser: 0.95 mg/dL (ref 0.57–1.00)
GFR calc Af Amer: 85 mL/min/{1.73_m2} (ref >59)
GFR calc non Af Amer: 74 mL/min/{1.73_m2} (ref >59)
Globulin, Total: 2.9 g/dL (ref 1.5–4.5)
Glucose: 102 mg/dL — ABNORMAL HIGH (ref 65–99)
Potassium: 4.2 mmol/L (ref 3.5–5.2)
Sodium: 141 mmol/L (ref 134–144)
Total Protein: 7 g/dL (ref 6.0–8.5)

## 2019-12-22 LAB — CBC WITH DIFFERENTIAL
Basophils Absolute: 0.1 10*3/uL (ref 0.0–0.2)
Basos: 1 %
EOS (ABSOLUTE): 0.1 10*3/uL (ref 0.0–0.4)
Eos: 1 %
Hematocrit: 40.7 % (ref 34.0–46.6)
Hemoglobin: 13.1 g/dL (ref 11.1–15.9)
Immature Grans (Abs): 0 10*3/uL (ref 0.0–0.1)
Immature Granulocytes: 0 %
Lymphocytes Absolute: 1.8 10*3/uL (ref 0.7–3.1)
Lymphs: 24 %
MCH: 28 pg (ref 26.6–33.0)
MCHC: 32.2 g/dL (ref 31.5–35.7)
MCV: 87 fL (ref 79–97)
Monocytes Absolute: 0.4 10*3/uL (ref 0.1–0.9)
Monocytes: 6 %
Neutrophils Absolute: 5.3 10*3/uL (ref 1.4–7.0)
Neutrophils: 68 %
RBC: 4.68 x10E6/uL (ref 3.77–5.28)
RDW: 14.8 % (ref 11.7–15.4)
WBC: 7.7 10*3/uL (ref 3.4–10.8)

## 2019-12-22 LAB — 25-HYDROXY VITAMIN D LCMS D2+D3
25-Hydroxy, Vitamin D-2: <1 ng/mL
25-Hydroxy, Vitamin D-3: 43 ng/mL
25-Hydroxy, Vitamin D: 43 ng/mL

## 2019-12-22 LAB — TSH: TSH: 0.816 u[IU]/mL (ref 0.450–4.500)

## 2020-03-08 ENCOUNTER — Other Ambulatory Visit: Payer: Self-pay | Admitting: Obstetrics

## 2020-03-08 DIAGNOSIS — Z1231 Encounter for screening mammogram for malignant neoplasm of breast: Secondary | ICD-10-CM

## 2020-03-27 ENCOUNTER — Ambulatory Visit: Payer: 59

## 2020-04-03 ENCOUNTER — Other Ambulatory Visit: Payer: Self-pay

## 2020-04-03 ENCOUNTER — Ambulatory Visit
Admission: RE | Admit: 2020-04-03 | Discharge: 2020-04-03 | Disposition: A | Discharge status: Home / Self Care | Payer: 59 | Source: Ambulatory Visit | Attending: Obstetrics | Admitting: Obstetrics

## 2020-04-03 DIAGNOSIS — Z1231 Encounter for screening mammogram for malignant neoplasm of breast: Secondary | ICD-10-CM | POA: Diagnosis present

## 2020-12-20 ENCOUNTER — Ambulatory Visit: Payer: 59 | Admitting: Obstetrics and Gynecology

## 2020-12-22 ENCOUNTER — Other Ambulatory Visit: Payer: Self-pay

## 2020-12-22 ENCOUNTER — Ambulatory Visit: Payer: 59 | Admitting: Obstetrics

## 2020-12-22 ENCOUNTER — Ambulatory Visit (INDEPENDENT_AMBULATORY_CARE_PROVIDER_SITE_OTHER): Payer: 59 | Admitting: Obstetrics

## 2020-12-22 ENCOUNTER — Encounter: Payer: Self-pay | Admitting: Obstetrics

## 2020-12-22 VITALS — BP 122/74 | Ht 64.0 in | Wt 195.6 lb

## 2020-12-22 DIAGNOSIS — B372 Candidiasis of skin and nail: Secondary | ICD-10-CM | POA: Diagnosis not present

## 2020-12-22 DIAGNOSIS — Z1231 Encounter for screening mammogram for malignant neoplasm of breast: Secondary | ICD-10-CM | POA: Diagnosis not present

## 2020-12-22 DIAGNOSIS — Z01419 Encounter for gynecological examination (general) (routine) without abnormal findings: Secondary | ICD-10-CM

## 2020-12-22 MED ORDER — NYSTATIN 100000 UNIT/GM EX CREA: 1.0000 "application " | TOPICAL_CREAM | Freq: Two times a day (BID) | CUTANEOUS | 1 refills | Status: DC

## 2020-12-22 MED ORDER — LEVONORGESTREL-ETHINYL ESTRAD 0.15-30 MG-MCG PO TABS: 1.0000 | ORAL_TABLET | Freq: Every day | ORAL | 3 refills | Status: DC

## 2020-12-22 NOTE — Progress Notes (Signed)
Gynecology Annual Exam  PCP: Diana Contreras, No Pcp Per (Inactive)  Chief Complaint:  Chief Complaint  Diana Contreras presents with   Annual Exam    History of Present Illness: Diana Contreras is a 45 y.o. K3T4656 presents for annual exam. The Diana Contreras has no complaints today. She is due for a mammogram either at the end of this year or early in 2023.  LMP: Diana Contreras's last menstrual period was 11/30/2020 (exact date). Average Interval: regular, 28 days Duration of flow: 4 days Heavy Menses: no Clots: no Intermenstrual Bleeding: no Postcoital Bleeding: no Dysmenorrhea: no   The Diana Contreras is sexually active. She currently uses OCP (estrogen/progesterone) for contraception. She denies dyspareunia.  The Diana Contreras does perform self breast exams.  There is no notable family history of breast or ovarian cancer in her family.  The Diana Contreras wears seatbelts: yes.   The Diana Contreras has regular exercise: not asked.    The Diana Contreras denies current symptoms of depression.    Review of Systems: ROS  Past Medical History:  Diana Contreras Active Problem List   Diagnosis Date Noted   Well woman exam with routine gynecological exam 05/16/2016    Past Surgical History:  Past Surgical History:  Procedure Laterality Date   NO PAST SURGERIES      Gynecologic History:  Diana Contreras's last menstrual period was 11/30/2020 (exact date). Contraception: OCP (estrogen/progesterone) Last Pap: Results were: 2021 no abnormalities  Last mammogram: 2021 Results were: BI-RAD I  Obstetric History: G2P2002  Family History:  Family History  Problem Relation Age of Onset   Hypertension Mother    Diabetes Father    Hypertension Father    Diabetes Paternal Uncle    Heart disease Maternal Grandmother        died from MI   Stroke Maternal Grandmother    Bladder Cancer Maternal Grandfather    Breast cancer Paternal Grandmother 58   Hypertension Paternal Grandmother    Stroke Paternal Grandmother     Social History:  Social History    Socioeconomic History   Marital status: Married    Spouse name: Not on file   Number of children: 2   Years of education: Not on file   Highest education level: Not on file  Occupational History   Occupation: Automotive engineer    Comment: [employer]  Tobacco Use   Smoking status: Never   Smokeless tobacco: Never  Vaping Use   Vaping Use: Never used  Substance and Sexual Activity   Alcohol use: Yes   Drug use: No   Sexual activity: Yes    Birth control/protection: Pill  Other Topics Concern   Not on file  Social History Narrative   Not on file   Social Determinants of Health   Financial Resource Strain: Not on file  Food Insecurity: Not on file  Transportation Needs: Not on file  Physical Activity: Not on file  Stress: Not on file  Social Connections: Not on file  Intimate Partner Violence: Not on file    Allergies:  No Known Allergies  Medications: Prior to Admission medications   Medication Sig Start Date End Date Taking? Authorizing Provider  levonorgestrel-ethinyl estradiol (LILLOW) 0.15-30 MG-MCG tablet Take 1 tablet by mouth daily. 12/14/19  Yes Mirna Mires, CNM  acyclovir (ZOVIRAX) 400 MG tablet Take 400 mg by mouth 3 (three) times daily. X 3-5 days Prn fever blisters Diana Contreras not taking: Reported on 12/22/2020    [provider]    Physical Exam Vitals: Blood pressure 122/74, height  5\' 4"  (1.626 m), weight 195 lb 9.6 oz (88.7 kg), last menstrual period 11/30/2020.  General: NAD HEENT: normocephalic, anicteric Thyroid: no enlargement, no palpable nodules Pulmonary: No increased work of breathing, CTAB Cardiovascular: RRR, distal pulses 2+ Breast: Breast symmetrical, no tenderness, no palpable nodules or masses, no skin or nipple retraction present, no nipple discharge. She does have some slightly reddish, shiny, and itchiy skin under the drape of each breast.  No axillary or supraclavicular lymphadenopathy. Abdomen: NABS, soft,  non-tender, non-distended.  Umbilicus without lesions.  No hepatomegaly, splenomegaly or masses palpable. No evidence of hernia  Genitourinary:  External: Normal external female genitalia.  Normal urethral meatus, normal Bartholin's and Skene's glands.    Vagina: Normal vaginal mucosa, no evidence of prolapse.    Cervix: Grossly normal in appearance, no bleeding  Uterus: Non-enlarged, mobile, normal contour.  No CMT  Adnexa: ovaries non-enlarged, no adnexal masses  Rectal: deferred  Lymphatic: no evidence of inguinal lymphadenopathy Extremities: no edema, erythema, or tenderness Neurologic: Grossly intact Psychiatric: mood appropriate, affect full  Female chaperone present for pelvic and breast  portions of the physical exam    Assessment: 45 y.o. G2P2002 routine annual exam  Plan: Problem List Items Addressed This Visit   None   1) Mammogram - recommend yearly screening mammogram.  Mammogram Was ordered today   2) STI screening  wasoffered and declined  3) ASCCP guidelines and rational discussed.  Diana Contreras opts for every 3 years screening interval  4) Contraception - the Diana Contreras is currently using  OCP (estrogen/progesterone).  She is happy with her current form of contraception and plans to continue  5) Colonoscopy -- Screening recommended starting at age 60 for average risk individuals, age 72 for individuals deemed at increased risk (including African Americans) and recommended to continue until age 40.  For Diana Contreras age 39-85 individualized approach is recommended.  Gold standard screening is via colonoscopy, Cologuard screening is an acceptable alternative for Diana Contreras unwilling or unable to undergo colonoscopy.  "Colorectal cancer screening for average?risk adults: 2018 guideline update from the American Cancer Society"CA: A Cancer Journal for Clinicians: Aug 07, 2016   6) Routine healthcare maintenance including cholesterol, diabetes screening discussed managed by PCP  7)  Return in about 1 year (around 12/22/2021) for annual.   12/24/2021, CNM  12/22/2020 4:02 PM   [facility] 12/22/2020, 4:01 PM

## 2021-01-18 ENCOUNTER — Other Ambulatory Visit: Payer: Self-pay | Admitting: Obstetrics

## 2021-01-18 DIAGNOSIS — Z1231 Encounter for screening mammogram for malignant neoplasm of breast: Secondary | ICD-10-CM

## 2021-02-13 ENCOUNTER — Other Ambulatory Visit: Payer: Self-pay | Admitting: Obstetrics

## 2021-02-13 DIAGNOSIS — Z1231 Encounter for screening mammogram for malignant neoplasm of breast: Secondary | ICD-10-CM

## 2021-04-04 ENCOUNTER — Ambulatory Visit
Admission: RE | Admit: 2021-04-04 | Discharge: 2021-04-04 | Disposition: A | Discharge status: Home / Self Care | Payer: 59 | Source: Ambulatory Visit | Attending: Obstetrics | Admitting: Obstetrics

## 2021-04-04 ENCOUNTER — Other Ambulatory Visit: Payer: Self-pay

## 2021-04-04 DIAGNOSIS — Z1231 Encounter for screening mammogram for malignant neoplasm of breast: Secondary | ICD-10-CM | POA: Diagnosis present

## 2022-01-16 ENCOUNTER — Other Ambulatory Visit: Payer: Self-pay | Admitting: Obstetrics

## 2022-01-16 DIAGNOSIS — Z01419 Encounter for gynecological examination (general) (routine) without abnormal findings: Secondary | ICD-10-CM

## 2022-01-29 ENCOUNTER — Ambulatory Visit (INDEPENDENT_AMBULATORY_CARE_PROVIDER_SITE_OTHER): Payer: 59 | Admitting: Obstetrics

## 2022-01-29 ENCOUNTER — Encounter: Payer: Self-pay | Admitting: Obstetrics

## 2022-01-29 VITALS — Ht 64.0 in | Wt 200.0 lb

## 2022-01-29 DIAGNOSIS — F419 Anxiety disorder, unspecified: Secondary | ICD-10-CM | POA: Diagnosis not present

## 2022-01-29 DIAGNOSIS — N393 Stress incontinence (female) (male): Secondary | ICD-10-CM

## 2022-01-29 DIAGNOSIS — Z0000 Encounter for general adult medical examination without abnormal findings: Secondary | ICD-10-CM

## 2022-01-29 DIAGNOSIS — Z01419 Encounter for gynecological examination (general) (routine) without abnormal findings: Secondary | ICD-10-CM | POA: Diagnosis not present

## 2022-01-29 MED ORDER — HYDROXYZINE PAMOATE 50 MG PO CAPS: 50.0000 mg | ORAL_CAPSULE | Freq: Once | ORAL | 0 refills | Status: DC | PRN

## 2022-01-29 MED ORDER — LEVONORGESTREL-ETHINYL ESTRAD 0.15-30 MG-MCG PO TABS: 1.0000 | ORAL_TABLET | Freq: Every day | ORAL | 0 refills | Status: DC

## 2022-01-29 NOTE — Progress Notes (Signed)
Gynecology Annual Exam  PCP: Patient, No Pcp Per  Chief Complaint:  Chief Complaint  Patient presents with   Annual Exam    History of Present Illness: Patient is a 46 y.o. [id number] presents for annual exam. The patient has no complaints today. She is known to the practice.  No big health changes this years, although she does report some urinary urge incontinence. She uses OCPs for Birth control and wants to continue. No premenopausal symptoms like hot flashes.  She is traveling this week, and shares some anxiety regarding her flights. She does not like large crowds and "tight' spaces. Wondering if she can use a one time dose of medication to reduce her anxiety on the flight. She does not take any mood medication.  LMP: Patient's last menstrual period was 01/01/2022 (exact date). Average Interval: regular, 28 days Duration of flow: 3 days Heavy Menses: no Clots: no Intermenstrual Bleeding: no Postcoital Bleeding: no Dysmenorrhea: no   The patient is sexually active. She currently uses OCP (estrogen/progesterone) for contraception. She denies dyspareunia.  The patient does perform self breast exams.  There is no notable family history of breast or ovarian cancer in her family.  The patient wears seatbelts: yes.   The patient has regular exercise: yes.    The patient denies current symptoms of depression.    Review of Systems: Review of Systems  Constitutional: Negative.   HENT: Negative.    Eyes: Negative.   Respiratory: Negative.    Cardiovascular: Negative.   Gastrointestinal: Negative.   Genitourinary:        Some stress incontinence noted recently  Skin: Negative.   Psychiatric/Behavioral:  The patient is nervous/anxious.   All other systems reviewed and are negative.   Past Medical History:  Patient Active Problem List   Diagnosis Date Noted   Well woman exam with routine gynecological exam 05/16/2016    Past Surgical History:  Past Surgical History:   Procedure Laterality Date   NO PAST SURGERIES      Gynecologic History:  Patient's last menstrual period was 01/01/2022 (exact date). Contraception: OCP (estrogen/progesterone) Last Pap: Results were: NILM no abnormalities  Last mammogram: 04/04/2021 Results were: BI-RAD I  Obstetric History: [id number]  Family History:  Family History  Problem Relation Age of Onset   Hypertension Mother    Diabetes Father    Hypertension Father    Diabetes Paternal Uncle    Heart disease Maternal Grandmother        died from MI   Stroke Maternal Grandmother    Bladder Cancer Maternal Grandfather    Breast cancer Paternal Grandmother 8   Hypertension Paternal Grandmother    Stroke Paternal Grandmother     Social History:  Social History   Socioeconomic History   Marital status: Married    Spouse name: Not on file   Number of children: 2   Years of education: Not on file   Highest education level: Not on file  Occupational History   Occupation: Automotive engineer    Comment: [employer]  Tobacco Use   Smoking status: Never   Smokeless tobacco: Never  Vaping Use   Vaping Use: Never used  Substance and Sexual Activity   Alcohol use: Yes   Drug use: No   Sexual activity: Yes    Birth control/protection: Pill  Other Topics Concern   Not on file  Social History Narrative   Not on file   Social Determinants of Corporate investment banker  Strain: Not on file  Food Insecurity: Not on file  Transportation Needs: Not on file  Physical Activity: Inactive (07/01/2017)   Exercise Vital Sign    Days of Exercise per Week: 0 days    Minutes of Exercise per Session: 0 min  Stress: No Stress Concern Present (07/01/2017)   [employer] - Occupational Stress Questionnaire    Feeling of Stress : Not at all  Social Connections: Not on file  Intimate Partner Violence: Not on file    Allergies:  No Known Allergies  Medications: Prior to Admission  medications   Medication Sig Start Date End Date Taking? Authorizing Provider  acyclovir (ZOVIRAX) 400 MG tablet Take 400 mg by mouth 3 (three) times daily. X 3-5 days Prn fever blisters   Yes [provider]  levonorgestrel-ethinyl estradiol (ALTAVERA) 0.15-30 MG-MCG tablet TAKE 1 TABLET BY MOUTH EVERY DAY 01/16/22  Yes Mirna MiresFryer, Jamone Garrido M, CNM  nystatin cream (MYCOSTATIN) Apply 1 application topically 2 (two) times daily. 12/22/20  Yes Mirna MiresFryer, Ellagrace Yoshida M, CNM    Physical Exam Vitals: Height 5\' 4"  (1.626 m), weight 200 lb (90.7 kg), last menstrual period 01/01/2022.  General: NAD HEENT: normocephalic, anicteric Thyroid: no enlargement, no palpable nodules Pulmonary: No increased work of breathing, CTAB Cardiovascular: RRR, distal pulses 2+ Breast: Breast symmetrical, no tenderness, no palpable nodules or masses, no skin or nipple retraction present, no nipple discharge.  No axillary or supraclavicular lymphadenopathy. Abdomen: NABS, soft, non-tender, non-distended.  Umbilicus without lesions.  No hepatomegaly, splenomegaly or masses palpable. No evidence of hernia  Genitourinary:  External: Normal external female genitalia.  Normal urethral meatus, normal Bartholin's and Skene's glands.    Vagina: Normal vaginal mucosa, mild prolapse.? Rectocele. She has greatly diminished Kegel strength. Cannot perform Kegel exercise.   Cervix: Grossly normal in appearance, no bleeding  Uterus: Non-enlarged, mobile, normal contour.  No CMT  Adnexa: ovaries non-enlarged, no adnexal masses  Rectal: deferred  Lymphatic: no evidence of inguinal lymphadenopathy Extremities: no edema, erythema, or tenderness Neurologic: Grossly intact Psychiatric: mood appropriate, affect full  Female chaperone present for pelvic and breast  portions of the physical exam    Assessment: 46 y.o. [id number] routine annual exam Pap smear is up to date. Anxiety regardign upcoming flight. Stress incontinence  symptoms  Plan: Problem List Items Addressed This Visit   None Visit Diagnoses     Women's annual routine gynecological examination    -  Primary       1) Mammogram - recommend yearly screening mammogram.  Mammogram Is up to date   2) STI screening  wasoffered and declined  3) ASCCP guidelines and rational discussed.  Patient opts for every 3 years screening interval  4) Contraception - the patient is currently using  OCP (estrogen/progesterone).  She is happy with her current form of contraception and plans to continue  5) Colonoscopy -- Screening recommended starting at age 46 for average risk individuals, age 46 for individuals deemed at increased risk (including African Americans) and recommended to continue until age 46.  For patient age 46-85 individualized approach is recommended.  Gold standard screening is via colonoscopy, Cologuard screening is an acceptable alternative for patient unwilling or unable to undergo colonoscopy.  "Colorectal cancer screening for average?risk adults: 2018 guideline update from the American Cancer Society"CA: A Cancer Journal for Clinicians: Aug 07, 2016   6) Routine healthcare maintenance including cholesterol, diabetes screening discussed Ordered today she will return fasting for the lab work that is ordered  today.  7) No follow-ups on file. I have put in a referral to Dr. Tommas Olp office to discuss her urinary symptoms. 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     Imagene Riches, CNM  01/29/2022 2:42 PM   [facility], [facility] 01/29/2022, 2:42 PM

## 2022-02-04 ENCOUNTER — Other Ambulatory Visit: Payer: 59

## 2022-02-04 DIAGNOSIS — Z0000 Encounter for general adult medical examination without abnormal findings: Secondary | ICD-10-CM

## 2022-02-05 ENCOUNTER — Encounter: Payer: Self-pay | Admitting: Certified Nurse Midwife

## 2022-02-05 LAB — LIPID PANEL
Chol/HDL Ratio: 3.1 ratio (ref 0.0–4.4)
Cholesterol, Total: 201 mg/dL — ABNORMAL HIGH (ref 100–199)
HDL: 64 mg/dL (ref >39)
LDL Chol Calc (NIH): 119 mg/dL — ABNORMAL HIGH (ref 0–99)
Triglycerides: 101 mg/dL (ref 0–149)
VLDL Cholesterol Cal: 18 mg/dL (ref 5–40)

## 2022-02-05 LAB — COMPREHENSIVE METABOLIC PANEL
ALT: 27 IU/L (ref 0–32)
AST: 22 IU/L (ref 0–40)
Albumin/Globulin Ratio: 1.3 (ref 1.2–2.2)
Albumin: 4 g/dL (ref 3.9–4.9)
Alkaline Phosphatase: 90 IU/L (ref 44–121)
BUN/Creatinine Ratio: 10 (ref 9–23)
BUN: 9 mg/dL (ref 6–24)
Bilirubin Total: 0.4 mg/dL (ref 0.0–1.2)
CO2: 23 mmol/L (ref 20–29)
Calcium: 9 mg/dL (ref 8.7–10.2)
Chloride: 103 mmol/L (ref 96–106)
Creatinine, Ser: 0.94 mg/dL (ref 0.57–1.00)
Globulin, Total: 3 g/dL (ref 1.5–4.5)
Glucose: 99 mg/dL (ref 70–99)
Potassium: 4.1 mmol/L (ref 3.5–5.2)
Sodium: 138 mmol/L (ref 134–144)
Total Protein: 7 g/dL (ref 6.0–8.5)
eGFR: 76 mL/min/{1.73_m2} (ref >59)

## 2022-02-05 LAB — TSH+FREE T4
Free T4: 1.25 ng/dL (ref 0.82–1.77)
TSH: 0.727 u[IU]/mL (ref 0.450–4.500)

## 2022-04-07 ENCOUNTER — Other Ambulatory Visit: Payer: Self-pay

## 2022-04-07 DIAGNOSIS — Z01419 Encounter for gynecological examination (general) (routine) without abnormal findings: Secondary | ICD-10-CM

## 2022-05-20 ENCOUNTER — Other Ambulatory Visit: Payer: Self-pay | Admitting: Obstetrics

## 2022-05-20 DIAGNOSIS — Z1231 Encounter for screening mammogram for malignant neoplasm of breast: Secondary | ICD-10-CM

## 2022-06-06 ENCOUNTER — Ambulatory Visit
Admission: RE | Admit: 2022-06-06 | Discharge: 2022-06-06 | Disposition: A | Discharge status: Home / Self Care | Payer: 59 | Source: Ambulatory Visit | Attending: Obstetrics | Admitting: Obstetrics

## 2022-06-06 ENCOUNTER — Other Ambulatory Visit: Payer: Self-pay | Admitting: Obstetrics

## 2022-06-06 DIAGNOSIS — Z1231 Encounter for screening mammogram for malignant neoplasm of breast: Secondary | ICD-10-CM

## 2022-08-15 IMAGING — MG MM DIGITAL SCREENING BILAT W/ TOMO AND CAD
8 series · 8 of 24 positions shown · non-contrast
Comparison: Previous exam(s).

CLINICAL DATA: Screening.

EXAM:
DIGITAL SCREENING BILATERAL MAMMOGRAM WITH TOMOSYNTHESIS AND CAD
TECHNIQUE: Bilateral screening digital craniocaudal and mediolateral oblique
mammograms were obtained. Bilateral screening digital breast
tomosynthesis was performed. The images were evaluated with
computer-aided detection.

[R CC synth-2D]
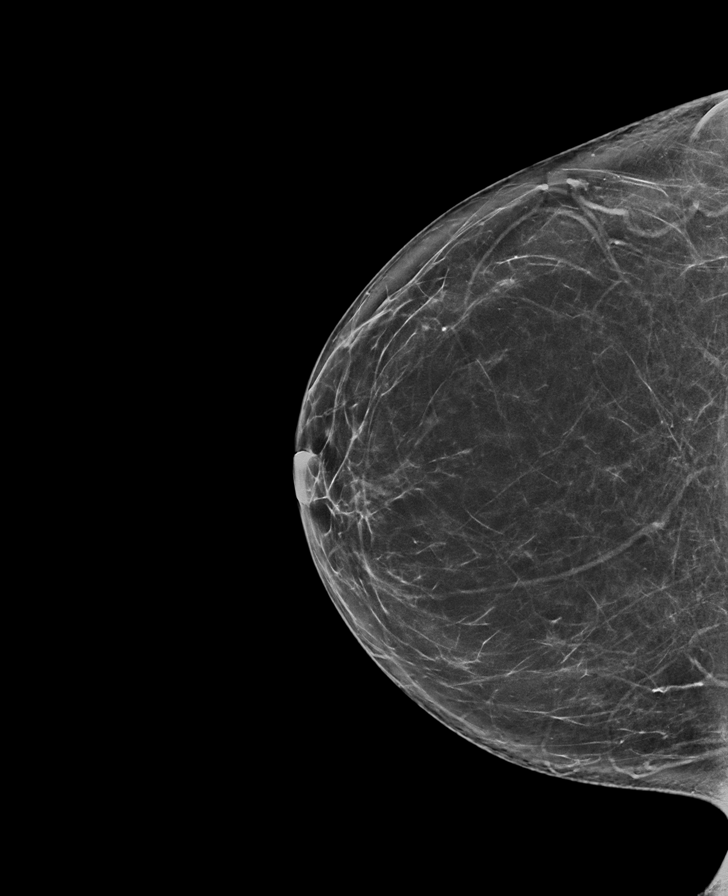

[R MLO synth-2D]
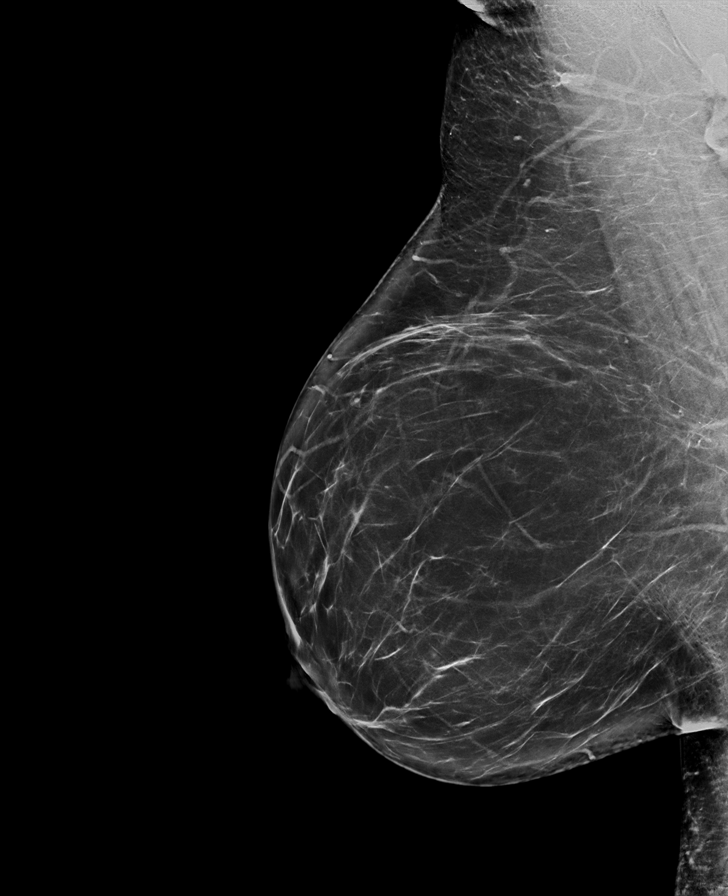

[L MLO synth-2D]
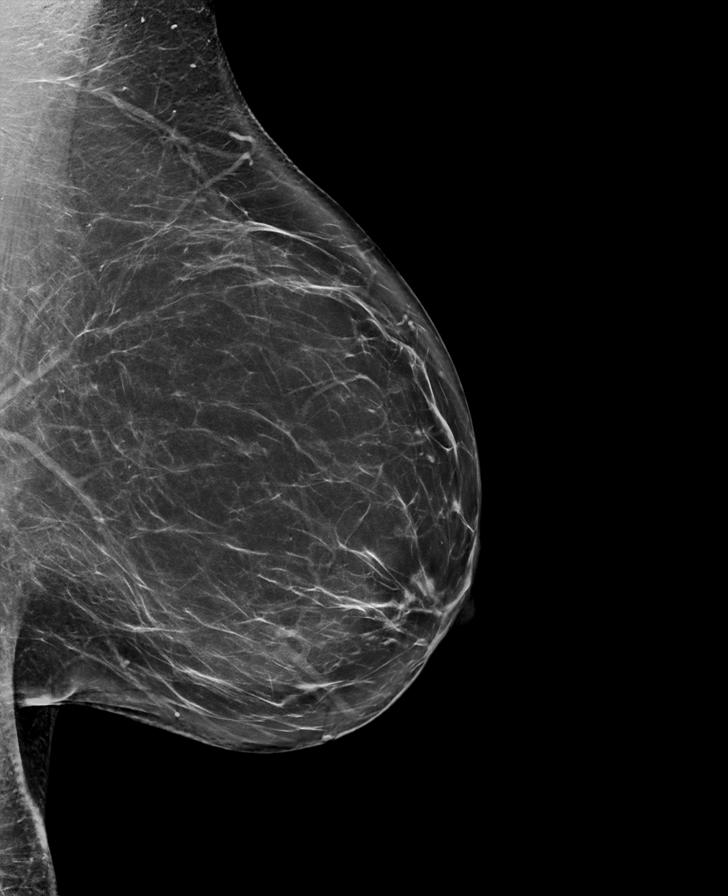

[L CC synth-2D]
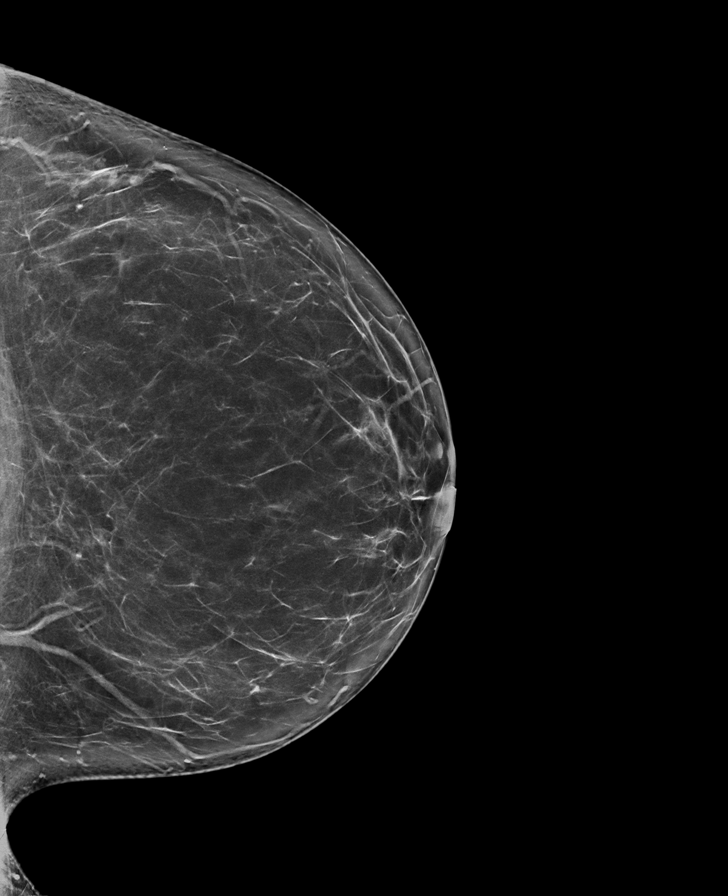

[R MLO tomo · tomo slice 45/89.0]
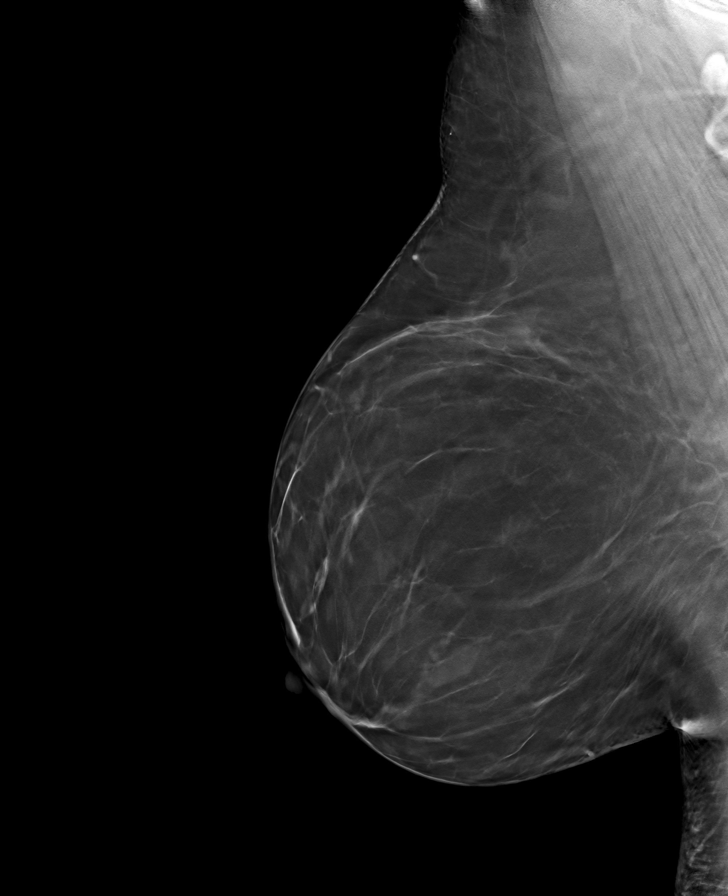

[L MLO tomo · tomo slice 45/89.0]
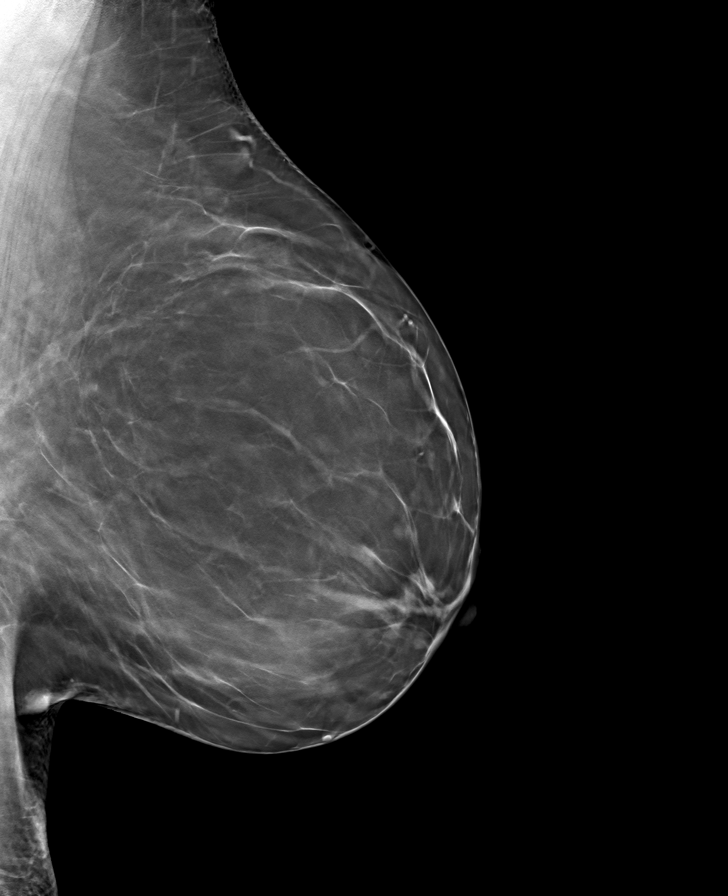

[R CC tomo · tomo slice 39/77.0]
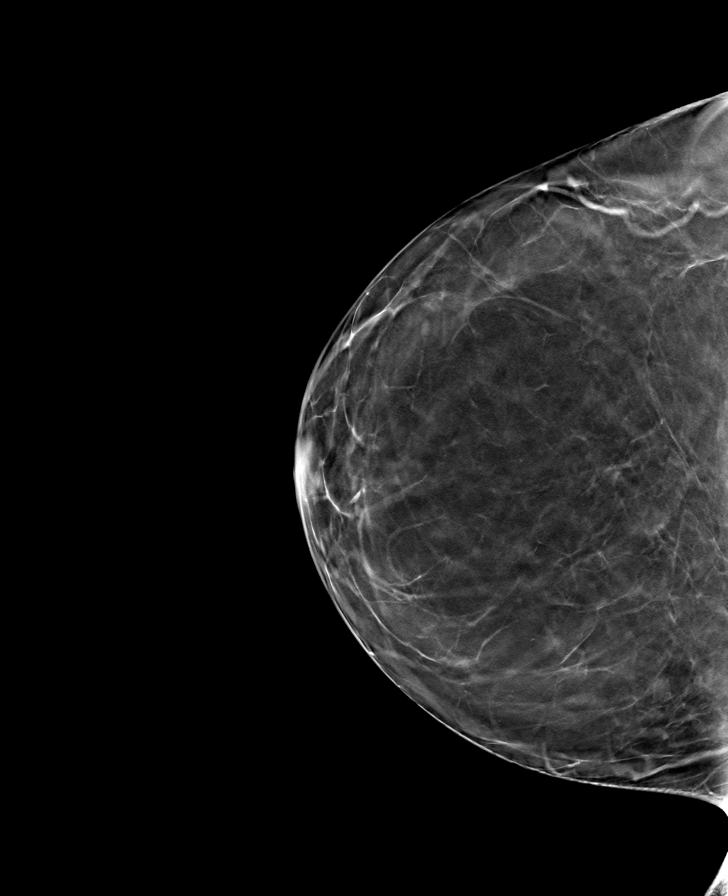

[L CC tomo · tomo slice 43/84.0]
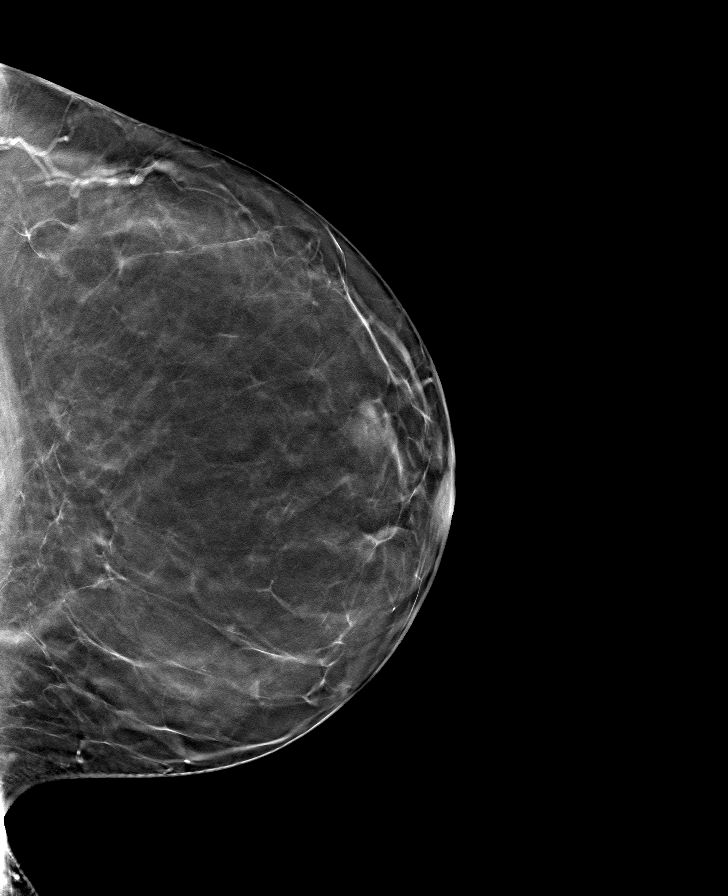

[8 of 24 positions shown; findings below may reference images not displayed]

ACR Breast Density Category b: There are scattered areas of
fibroglandular density.
FINDINGS: There are no findings suspicious for malignancy.
IMPRESSION: No mammographic evidence of malignancy. A result letter of this
screening mammogram will be mailed directly to the patient.

RECOMMENDATION:
Screening mammogram in one year. (Code:51-O-LD2)

BI-RADS CATEGORY  1: Negative.

## 2022-10-03 ENCOUNTER — Ambulatory Visit: Payer: 59 | Admitting: Family

## 2022-11-26 ENCOUNTER — Ambulatory Visit: Payer: 59 | Admitting: Family

## 2022-11-26 ENCOUNTER — Other Ambulatory Visit: Payer: Self-pay | Admitting: Family

## 2022-11-26 ENCOUNTER — Encounter: Payer: Self-pay | Admitting: Family

## 2022-11-26 VITALS — BP 122/78 | HR 77 | Temp 98.6°F | Ht 64.0 in | Wt 194.0 lb

## 2022-11-26 DIAGNOSIS — E7800 Pure hypercholesterolemia, unspecified: Secondary | ICD-10-CM | POA: Diagnosis not present

## 2022-11-26 DIAGNOSIS — Z8742 Personal history of other diseases of the female genital tract: Secondary | ICD-10-CM | POA: Insufficient documentation

## 2022-11-26 DIAGNOSIS — Z6833 Body mass index (BMI) 33.0-33.9, adult: Secondary | ICD-10-CM | POA: Insufficient documentation

## 2022-11-26 DIAGNOSIS — Z87898 Personal history of other specified conditions: Secondary | ICD-10-CM | POA: Insufficient documentation

## 2022-11-26 DIAGNOSIS — R002 Palpitations: Secondary | ICD-10-CM | POA: Insufficient documentation

## 2022-11-26 DIAGNOSIS — Z1211 Encounter for screening for malignant neoplasm of colon: Secondary | ICD-10-CM

## 2022-11-26 DIAGNOSIS — Z9189 Other specified personal risk factors, not elsewhere classified: Secondary | ICD-10-CM

## 2022-11-26 DIAGNOSIS — B009 Herpesviral infection, unspecified: Secondary | ICD-10-CM | POA: Diagnosis not present

## 2022-11-26 LAB — LIPID PANEL
Cholesterol: 190 mg/dL (ref 0–200)
HDL: 60.4 mg/dL (ref >39.00)
LDL Cholesterol: 105 mg/dL — ABNORMAL HIGH (ref 0–99)
NonHDL: 130.04
Total CHOL/HDL Ratio: 3
Triglycerides: 124 mg/dL (ref 0.0–149.0)
VLDL: 24.8 mg/dL (ref 0.0–40.0)

## 2022-11-26 MED ORDER — VALACYCLOVIR HCL 1 G PO TABS: ORAL_TABLET | ORAL | 0 refills | Status: DC

## 2022-11-26 MED ORDER — WEGOVY 0.25 MG/0.5ML ~~LOC~~ SOAJ
SUBCUTANEOUS | 0 refills | Status: DC
Start: 2022-11-26 — End: 2022-11-26

## 2022-11-26 MED ORDER — WEGOVY 0.5 MG/0.5ML ~~LOC~~ SOAJ
SUBCUTANEOUS | 2 refills | Status: DC
Start: 2022-11-26 — End: 2022-12-26

## 2022-11-26 NOTE — Assessment & Plan Note (Signed)
Pt advised to work on diet and exercise as tolerated Trial wegovy sending in RX

## 2022-11-26 NOTE — Progress Notes (Signed)
New Patient Office Visit  Subjective:  Patient ID: Diana Contreras, female    DOB: 04/20/1975  Age: 47 y.o. MRN: [id number]  CC:  Chief Complaint  Patient presents with   Establish Care    HPI Diana Contreras is here to establish care as a new patient.  Oriented to practice routines and expectations.  Prior provider was: has not had a primary in some time.  Gynecologist: Paula Compton, CNM  Pt is with acute concerns.   Obesity: she is trying to lose weight but having some problems trying to lose weight. She has a wedding coming up for her son and wants to know if anything can help with the weight loss. Her husband is on ozempic and has lost weight, which makes her curious if she can have some.   Exercise:  Diet: cut down on alcohol intake.  Her issue is stress eating.   Does have palpitations at times, but has not had in the last six months. No recent occurrence, denies chest pain and or sob. She does also state in the past felt she had elevated blood pressure, but again has not felt this way in the last six months.   Pap 12/14/19: negative  Mammogram: 06/06/22 Colonoscopy: has never had.  Flu shot, declines today   chronic concerns:  Anxiety: hydroxyxine as needed, usually only needed when flying. She never had to take this but likes to have with her just in case.   Fever blisters: triggered by stress or sun     ROS: Negative unless specifically indicated above in HPI.   Current Outpatient Medications:    hydrOXYzine (VISTARIL) 50 MG capsule, Take 1 capsule (50 mg total) by mouth once as needed for up to 1 dose., Disp: 5 capsule, Rfl: 0   levonorgestrel-ethinyl estradiol (ALTAVERA) 0.15-30 MG-MCG tablet, TAKE 1 TABLET BY MOUTH EVERY DAY, Disp: 84 tablet, Rfl: 3   Semaglutide-Weight Management (WEGOVY) 0.25 MG/0.5ML SOAJ, Start 0.25 mg qweek for four weeks, then increase to 0.5 mg dose qweek, Disp: 2 mL, Rfl: 0   Semaglutide-Weight Management (WEGOVY) 0.5 MG/0.5ML  SOAJ, After four weeks of 0.25 mg qweek, increase to 0.5 mg qweek, Disp: 2 mL, Rfl: 2   valACYclovir (VALTREX) 1000 MG tablet, Take two tablets twice a day prn outbreak, Disp: 30 tablet, Rfl: 0 Past Medical History:  Diagnosis Date   Abnormal Pap smear of cervix 10/30/2010   +HPV, pap on 12/10/11 neg/neg; pap 12/2012 neg/neg   Past Surgical History:  Procedure Laterality Date   NO PAST SURGERIES      Objective:   Today's Vitals: BP 122/78 (BP Location: Left Arm, Patient Position: Sitting, Cuff Size: Normal)   Pulse 77   Temp 98.6 F (37 C) (Temporal)   Ht 5\' 4"  (1.626 m)   Wt 194 lb (88 kg)   SpO2 98%   BMI 33.30 kg/m   Physical Exam Constitutional:      General: She is not in acute distress.    Appearance: Normal appearance. She is normal weight. She is not ill-appearing, toxic-appearing or diaphoretic.  HENT:     Head: Normocephalic.  Cardiovascular:     Rate and Rhythm: Normal rate and regular rhythm.  Pulmonary:     Effort: Pulmonary effort is normal.     Breath sounds: Normal breath sounds.  Musculoskeletal:        General: Normal range of motion.     Right lower leg: No edema.     Left lower  leg: No edema.  Neurological:     General: No focal deficit present.     Mental Status: She is alert and oriented to person, place, and time. Mental status is at baseline.  Psychiatric:        Mood and Affect: Mood normal.        Behavior: Behavior normal.        Thought Content: Thought content normal.        Judgment: Judgment normal.     Assessment & Plan:  Elevated LDL cholesterol level Assessment & Plan: Ordered lipid panel, pending results. Work on low cholesterol diet and exercise as tolerated   Orders: -     Lipid panel  Screening for colon cancer -     Ambulatory referral to Gastroenterology  HSV-1 (herpes simplex virus 1) infection Assessment & Plan: Rx valtrex 1 g prn   Orders: -     valACYclovir HCl; Take two tablets twice a day prn outbreak   Dispense: 30 tablet; Refill: 0  Adult BMI 33.0-33.9 kg/sq m Assessment & Plan: Pt advised to work on diet and exercise as tolerated Trial wegovy sending in RX    Orders: -     QIONGE; After four weeks of 0.25 mg qweek, increase to 0.5 mg qweek  Dispense: 2 mL; Refill: 2 -     Wegovy; Start 0.25 mg qweek for four weeks, then increase to 0.5 mg dose qweek  Dispense: 2 mL; Refill: 0  History of abnormal cervical Pap smear Assessment & Plan: Following with gyn    Palpitations Assessment & Plan: Currently asymptomatic  Ordering thyroid panel pending results.  Ddx ectopic beats, arrhythmia, hyperthyroid, anemia.  Advised pt to avoid triggers such as caffeine as able.  If they begin to reoccur more frequently will order zio monitor     Orders: -     Thyroid Panel With TSH  At risk for diabetes mellitus -     Wegovy; After four weeks of 0.25 mg qweek, increase to 0.5 mg qweek  Dispense: 2 mL; Refill: 2 -     Wegovy; Start 0.25 mg qweek for four weeks, then increase to 0.5 mg dose qweek  Dispense: 2 mL; Refill: 0  History of palpitations Assessment & Plan: No episode x 6 months.  Will order tsh to r/o any hyperthyroid ddx  Advised pt to decrease/avoid caffeine if it causes symptoms.  If comes back and or increasing in frequency pt to make appt for an EKG and possible zio monitor.      Follow-up: Return in about 3 months (around 02/25/2023) for f/u weight loss medication.   Mort Sawyers, FNP

## 2022-11-26 NOTE — Assessment & Plan Note (Signed)
Currently asymptomatic  Ordering thyroid panel pending results.  Ddx ectopic beats, arrhythmia, hyperthyroid, anemia.  Advised pt to avoid triggers such as caffeine as able.  If they begin to reoccur more frequently will order zio monitor

## 2022-11-26 NOTE — Assessment & Plan Note (Signed)
Ordered lipid panel, pending results. Work on low cholesterol diet and exercise as tolerated

## 2022-11-26 NOTE — Assessment & Plan Note (Signed)
No episode x 6 months.  Will order tsh to r/o any hyperthyroid ddx  Advised pt to decrease/avoid caffeine if it causes symptoms.  If comes back and or increasing in frequency pt to make appt for an EKG and possible zio monitor.

## 2022-11-26 NOTE — Assessment & Plan Note (Signed)
Rx valtrex 1 g prn

## 2022-11-26 NOTE — Patient Instructions (Signed)
  A referral was placed today for colonoscopy Please let us know if you have not heard back within 2 weeks about the referral.

## 2022-11-26 NOTE — Assessment & Plan Note (Signed)
Following with gyn

## 2022-11-27 ENCOUNTER — Telehealth: Payer: Self-pay

## 2022-11-27 ENCOUNTER — Other Ambulatory Visit: Payer: Self-pay

## 2022-11-27 DIAGNOSIS — Z1211 Encounter for screening for malignant neoplasm of colon: Secondary | ICD-10-CM

## 2022-11-27 MED ORDER — WEGOVY 0.25 MG/0.5ML ~~LOC~~ SOAJ: SUBCUTANEOUS | 0 refills | Status: DC

## 2022-11-27 MED ORDER — NA SULFATE-K SULFATE-MG SULF 17.5-3.13-1.6 GM/177ML PO SOLN: 1.0000 | Freq: Once | ORAL | 0 refills | Status: AC

## 2022-11-27 NOTE — Telephone Encounter (Signed)
Can we please get prior auth started for wegovy?

## 2022-11-27 NOTE — Telephone Encounter (Signed)
Gastroenterology Pre-Procedure Review  Request Date: 01/06/23 Requesting Physician: Dr. Meredith Mody  PATIENT REVIEW QUESTIONS: The patient responded to the following health history questions as indicated:    1. Are you having any GI issues? no 2. Do you have a personal history of Polyps? no 3. Do you have a family history of Colon Cancer or Polyps? no 4. Diabetes Mellitus? no 5. Joint replacements in the past 12 months?no 6. Major health problems in the past 3 months?no 7. Any artificial heart valves, MVP, or defibrillator?no    MEDICATIONS & ALLERGIES:    Patient reports the following regarding taking any anticoagulation/antiplatelet therapy:   Plavix, Coumadin, Eliquis, Xarelto, Lovenox, Pradaxa, Brilinta, or Effient? no Aspirin? no  Patient confirms/reports the following medications:  Current Outpatient Medications  Medication Sig Dispense Refill   hydrOXYzine (VISTARIL) 50 MG capsule Take 1 capsule (50 mg total) by mouth once as needed for up to 1 dose. 5 capsule 0   levonorgestrel-ethinyl estradiol (ALTAVERA) 0.15-30 MG-MCG tablet TAKE 1 TABLET BY MOUTH EVERY DAY 84 tablet 3   Semaglutide-Weight Management (WEGOVY) 0.25 MG/0.5ML SOAJ Start 0.25 mg qweek for four weeks, then increase to 0.5 mg dose qweek (Patient not taking: Reported on 11/27/2022) 2 mL 0   Semaglutide-Weight Management (WEGOVY) 0.5 MG/0.5ML SOAJ After four weeks of 0.25 mg qweek, increase to 0.5 mg qweek (Patient not taking: Reported on 11/27/2022) 2 mL 2   valACYclovir (VALTREX) 1000 MG tablet Take two tablets twice a day prn outbreak 30 tablet 0   No current facility-administered medications for this visit.    Patient confirms/reports the following allergies:  No Known Allergies  No orders of the defined types were placed in this encounter.   AUTHORIZATION INFORMATION Primary Insurance: 1D#: Group #:  Secondary Insurance: 1D#: Group #:  SCHEDULE INFORMATION: Date: 01/06/23 Time: Location: [facility]

## 2022-11-28 ENCOUNTER — Telehealth: Payer: Self-pay

## 2022-11-28 NOTE — Telephone Encounter (Signed)
Pharmacy Patient Advocate Encounter   Received notification from RX Request Messages that prior authorization for [facility] 0.25MG /0.5ML auto-injectors is required/requested.   Insurance verification completed.   The patient is insured through [facility] .   Per test claim: PA required; PA submitted to [facility] via CoverMyMeds Key/confirmation #/[id number] [facility] Status is pending

## 2022-12-03 ENCOUNTER — Other Ambulatory Visit (HOSPITAL_COMMUNITY): Payer: Self-pay

## 2022-12-03 NOTE — Telephone Encounter (Signed)
Pharmacy Patient Advocate Encounter  Received notification from [facility] that Prior Authorization for [facility] 0.25MG /0.5ML auto-injectors has been APPROVED from 11/28/22 to 06/26/23   PA #/Case ID/Reference #: [id number]

## 2022-12-06 ENCOUNTER — Other Ambulatory Visit: Payer: Self-pay | Admitting: Family

## 2022-12-06 DIAGNOSIS — B009 Herpesviral infection, unspecified: Secondary | ICD-10-CM

## 2022-12-23 ENCOUNTER — Encounter: Payer: Self-pay | Admitting: Family

## 2022-12-23 DIAGNOSIS — Z6833 Body mass index (BMI) 33.0-33.9, adult: Secondary | ICD-10-CM

## 2022-12-23 DIAGNOSIS — Z9189 Other specified personal risk factors, not elsewhere classified: Secondary | ICD-10-CM

## 2022-12-26 MED ORDER — WEGOVY 0.5 MG/0.5ML ~~LOC~~ SOAJ
SUBCUTANEOUS | 2 refills | Status: DC
Start: 2022-12-26 — End: 2023-02-04

## 2022-12-26 NOTE — Addendum Note (Signed)
Addended by: Mort Sawyers on: 12/26/2022 01:17 PM   Modules accepted: Orders

## 2023-01-03 ENCOUNTER — Encounter: Payer: Self-pay | Admitting: Gastroenterology

## 2023-01-06 ENCOUNTER — Encounter: Payer: Self-pay | Admitting: Gastroenterology

## 2023-01-06 ENCOUNTER — Ambulatory Visit
Admission: RE | Admit: 2023-01-06 | Discharge: 2023-01-06 | Disposition: A | Discharge status: Home / Self Care | Payer: 59 | Attending: Gastroenterology | Admitting: Gastroenterology

## 2023-01-06 ENCOUNTER — Encounter
Admission: RE | Disposition: A | Discharge status: Home / Self Care | Payer: Self-pay | Source: Home / Self Care | Attending: Gastroenterology

## 2023-01-06 ENCOUNTER — Ambulatory Visit: Payer: 59 | Admitting: Certified Registered Nurse Anesthetist

## 2023-01-06 DIAGNOSIS — Z1211 Encounter for screening for malignant neoplasm of colon: Secondary | ICD-10-CM | POA: Diagnosis not present

## 2023-01-06 HISTORY — PX: COLONOSCOPY WITH PROPOFOL: SHX5780

## 2023-01-06 LAB — POCT PREGNANCY, URINE: Preg Test, Ur: NEGATIVE

## 2023-01-06 SURGERY — COLONOSCOPY WITH PROPOFOL
Anesthesia: General

## 2023-01-06 MED ORDER — PROPOFOL 10 MG/ML IV BOLUS
INTRAVENOUS | Status: DC | PRN
  Administered 2023-01-06 (×2): 30 mg via INTRAVENOUS
  Administered 2023-01-06: 70 mg via INTRAVENOUS

## 2023-01-06 MED ORDER — PROPOFOL 500 MG/50ML IV EMUL
INTRAVENOUS | Status: DC | PRN
  Administered 2023-01-06: 170 ug/kg/min via INTRAVENOUS

## 2023-01-06 MED ORDER — SODIUM CHLORIDE 0.9 % IV SOLN
INTRAVENOUS | Status: DC
  Administered 2023-01-06: 20 mL/h via INTRAVENOUS

## 2023-01-06 NOTE — Anesthesia Procedure Notes (Signed)
Date/Time: 01/06/2023 11:00 AM  Performed by: Malva Cogan, CRNAPre-anesthesia Checklist: Patient identified, Emergency Drugs available, Suction available, Patient being monitored and Timeout performed Patient Re-evaluated:Patient Re-evaluated prior to induction Oxygen Delivery Method: Nasal cannula Induction Type: IV induction Placement Confirmation: CO2 detector and positive ETCO2

## 2023-01-06 NOTE — Transfer of Care (Signed)
Immediate Anesthesia Transfer of Care Note  Patient: Diana Contreras  Procedure(s) Performed: COLONOSCOPY WITH PROPOFOL  Patient Location: PACU  Anesthesia Type:General  Level of Consciousness: drowsy  Airway & Oxygen Therapy: Patient Spontanous Breathing  Post-op Assessment: Report given to RN and Post -op Vital signs reviewed and stable  Post vital signs: Reviewed and stable  Last Vitals:  Vitals Value Taken Time  BP 122/82 01/06/23 1118  Temp 36.1 C 01/06/23 1118  Pulse 113 01/06/23 1119  Resp 25 01/06/23 1119  SpO2 99 % 01/06/23 1119  Vitals shown include unfiled device data.  Last Pain:  Vitals:   01/06/23 1118  TempSrc: Temporal  PainSc: Asleep         Complications: No notable events documented.

## 2023-01-06 NOTE — H&P (Signed)
Arlyss Repress, MD [address]  [address]  [place], [place] [address]  Main: [phone]  Fax: [phone] Pager: [phone]  Primary Care Physician:  Mort Sawyers, FNP Primary Gastroenterologist:  Dr. Arlyss Repress  Pre-Procedure History & Physical: HPI:  Diana Contreras is a 47 y.o. female is here for an colonoscopy.   Past Medical History:  Diagnosis Date   Abnormal Pap smear of cervix 10/30/2010   +HPV, pap on 12/10/11 neg/neg; pap 12/2012 neg/neg    Past Surgical History:  Procedure Laterality Date   NO PAST SURGERIES      Prior to Admission medications   Medication Sig Start Date End Date Taking? Authorizing Provider  hydrOXYzine (VISTARIL) 50 MG capsule Take 1 capsule (50 mg total) by mouth once as needed for up to 1 dose. 01/29/22  Yes Mirna Mires, CNM  levonorgestrel-ethinyl estradiol Erenest Rasher) 0.15-30 MG-MCG tablet TAKE 1 TABLET BY MOUTH EVERY DAY 04/08/22  Yes Mirna Mires, CNM  Semaglutide-Weight Management [facility]) 0.5 MG/0.5ML SOAJ Inject [place] weekly 0.5 mg dosage 12/26/22  Yes Dugal, Tabitha, FNP  valACYclovir (VALTREX) 1000 MG tablet Take two tablets twice a day prn outbreak 11/26/22  Yes Mort Sawyers, FNP    Allergies as of 11/28/2022   (No Known Allergies)    Family History  Problem Relation Age of Onset   Hypertension Mother    Diabetes Mother        type 2   Diabetes Father        type 2   Hypertension Father    Heart disease Maternal Grandmother        died from MI   Stroke Maternal Grandmother    Heart attack Maternal Grandmother    Bladder Cancer Maternal Grandfather    Breast cancer Paternal Grandmother 54   Hypertension Paternal Grandmother    Stroke Paternal Grandmother    Diabetes Paternal Uncle        type 1    Social History   Socioeconomic History   Marital status: Married    Spouse name: Not on file   Number of children: 2   Years of education: Not on file   Highest education level: Not on file   Occupational History   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    Social Determinants of Health   Financial Resource Strain: Not on file  Food Insecurity: Not on file  Transportation Needs: Not on file  Physical Activity: Inactive (07/01/2017)   Exercise Vital Sign    Days of Exercise per Week: 0 days    Minutes of Exercise per Session: 0 min  Stress: No Stress Concern Present (07/01/2017)   [employer] of Occupational Health - Occupational Stress Questionnaire    Feeling of Stress : Not at all  Social Connections: Not on file  Intimate Partner Violence: Not on file    Review of Systems: See HPI, otherwise  negative ROS  Physical Exam: BP (!) 136/99   Pulse 89   Temp 97.8 F (36.6 C) (Temporal)   Resp 20   Ht 5\' 4"  (1.626 m)   Wt 82.6 kg   SpO2 100%   BMI 31.24 kg/m  General:   Alert,  pleasant and cooperative in NAD Head:  Normocephalic and atraumatic. Neck:  Supple; no masses or thyromegaly. Lungs:  Clear throughout to auscultation.    Heart:  Regular rate and rhythm. Abdomen:  Soft, nontender and nondistended. Normal bowel sounds, without guarding, and without rebound.   Neurologic:  Alert and  oriented x4;  grossly normal neurologically.  Impression/Plan: Diana Contreras is here for an colonoscopy to be performed for colon cancer screening  Risks, benefits, limitations, and alternatives regarding  colonoscopy have been reviewed with the patient.  Questions have been answered.  All parties  agreeable.   Lannette Donath, MD  01/06/2023, 10:43 AM

## 2023-01-06 NOTE — Op Note (Signed)
[facility] Gastroenterology Patient Name: Diana Contreras Procedure Date: 01/06/2023 10:49 AM MRN: [id number] Account #: [id number] Date of Birth: 12-19-1975 Admit Type: Outpatient Age: 47 Room: [facility] ENDO ROOM 2 Gender: Female Note Status: Finalized Instrument Name: Prentice Docker [id number] Procedure:             Colonoscopy Indications:           Screening for colorectal malignant neoplasm, This is                         the patient's first colonoscopy Providers:             Toney Reil MD, MD Referring MD:          Mort Sawyers (Referring MD) Medicines:             General Anesthesia Complications:         No immediate complications. Estimated blood loss: None. Procedure:             Pre-Anesthesia Assessment:                        - Prior to the procedure, a History and Physical was                         performed, and patient medications and allergies were                         reviewed. The patient is competent. The risks and                         benefits of the procedure and the sedation options and                         risks were discussed with the patient. All questions                         were answered and informed consent was obtained.                         Patient identification and proposed procedure were                         verified by the physician, the nurse, the                         anesthesiologist, the anesthetist and the technician                         in the pre-procedure area in the procedure room in the                         endoscopy suite. Mental Status Examination: alert and                         oriented. Airway Examination: normal oropharyngeal                         airway and neck mobility. Respiratory Examination:  clear to auscultation. CV Examination: normal.                         Prophylactic Antibiotics: The patient does not require                         prophylactic  antibiotics. Prior Anticoagulants: The                         patient has taken no anticoagulant or antiplatelet                         agents. ASA Grade Assessment: II - A patient with mild                         systemic disease. After reviewing the risks and                         benefits, the patient was deemed in satisfactory                         condition to undergo the procedure. The anesthesia                         plan was to use general anesthesia. Immediately prior                         to administration of medications, the patient was                         re-assessed for adequacy to receive sedatives. The                         heart rate, respiratory rate, oxygen saturations,                         blood pressure, adequacy of pulmonary ventilation, and                         response to care were monitored throughout the                         procedure. The physical status of the patient was                         re-assessed after the procedure.                        After obtaining informed consent, the colonoscope was                         passed under direct vision. Throughout the procedure,                         the patient's blood pressure, pulse, and oxygen                         saturations were monitored continuously. The  Colonoscope was introduced through the anus and                         advanced to the the cecum, identified by appendiceal                         orifice and ileocecal valve. The colonoscopy was                         performed without difficulty. The patient tolerated                         the procedure well. The quality of the bowel                         preparation was evaluated using the BBPS [facility] Bowel                         Preparation Scale) with scores of: Right Colon = 3,                         Transverse Colon = 3 and Left Colon = 3 (entire mucosa                         seen  well with no residual staining, small fragments                         of stool or opaque liquid). The total BBPS score                         equals 9. The ileocecal valve, appendiceal orifice,                         and rectum were photographed. Findings:      The perianal and digital rectal examinations were normal. Pertinent       negatives include normal sphincter tone and no palpable rectal lesions.      The entire examined colon appeared normal.      The retroflexed view of the distal rectum and anal verge was normal and       showed no anal or rectal abnormalities. Impression:            - The entire examined colon is normal.                        - The distal rectum and anal verge are normal on                         retroflexion view.                        - No specimens collected. Recommendation:        - Discharge patient to home (with escort).                        - Resume previous diet today.                        -  Continue present medications.                        - Repeat colonoscopy in 10 years for screening                         purposes. Procedure Code(s):     --- Professional ---                        Z6109, Colorectal cancer screening; colonoscopy on                         individual not meeting criteria for high risk Diagnosis Code(s):     --- Professional ---                        Z12.11, Encounter for screening for malignant neoplasm                         of colon CPT copyright 2022 [employer]. All rights reserved. The codes documented in this report are preliminary and upon coder review may  be revised to meet current compliance requirements. Dr. Libby Maw Toney Reil MD, MD 01/06/2023 11:16:01 AM This report has been signed electronically. Number of Addenda: 0 Note Initiated On: 01/06/2023 10:49 AM Scope Withdrawal Time: 0 hours 6 minutes 19 seconds  Total Procedure Duration: 0 hours 9 minutes 21 seconds   Estimated Blood Loss:  Estimated blood loss: none.      [facility]

## 2023-01-06 NOTE — Anesthesia Preprocedure Evaluation (Signed)
Anesthesia Evaluation  Patient identified by MRN, date of birth, ID band Patient awake    Reviewed: Allergy & Precautions, H&P , NPO status , Patient's Chart, lab work & pertinent test results, reviewed documented beta blocker date and time   History of Anesthesia Complications Negative for: history of anesthetic complications  Airway Mallampati: II  TM Distance: >3 FB Neck ROM: full    Dental  (+) Dental Advidsory Given, Teeth Intact   Pulmonary neg pulmonary ROS, Continuous Positive Airway Pressure Ventilation    Pulmonary exam normal breath sounds clear to auscultation       Cardiovascular Exercise Tolerance: Good negative cardio ROS Normal cardiovascular exam Rhythm:regular Rate:Normal     Neuro/Psych negative neurological ROS  negative psych ROS   GI/Hepatic Neg liver ROS,GERD  ,,  Endo/Other  negative endocrine ROS    Renal/GU negative Renal ROS  negative genitourinary   Musculoskeletal   Abdominal   Peds  Hematology negative hematology ROS (+)   Anesthesia Other Findings Past Medical History: 10/30/2010: Abnormal Pap smear of cervix     Comment:  +HPV, pap on 12/10/11 neg/neg; pap 12/2012 neg/neg   Reproductive/Obstetrics negative OB ROS                             Anesthesia Physical Anesthesia Plan  ASA: 2  Anesthesia Plan: General   Post-op Pain Management:    Induction: Intravenous  PONV Risk Score and Plan: 3 and Propofol infusion, TIVA and Treatment may vary due to age or medical condition  Airway Management Planned: Natural Airway and Nasal Cannula  Additional Equipment:   Intra-op Plan:   Post-operative Plan:   Informed Consent: I have reviewed the patients History and Physical, chart, labs and discussed the procedure including the risks, benefits and alternatives for the proposed anesthesia with the patient or authorized representative who has indicated  his/her understanding and acceptance.     Dental Advisory Given  Plan Discussed with: Anesthesiologist, CRNA and Surgeon  Anesthesia Plan Comments:        Anesthesia Quick Evaluation

## 2023-01-07 ENCOUNTER — Encounter: Payer: Self-pay | Admitting: Gastroenterology

## 2023-01-13 NOTE — Anesthesia Postprocedure Evaluation (Signed)
Anesthesia Post Note  Patient: Diana Contreras  Procedure(s) Performed: COLONOSCOPY WITH PROPOFOL  Patient location during evaluation: Endoscopy Anesthesia Type: General Level of consciousness: awake and alert Pain management: pain level controlled Vital Signs Assessment: post-procedure vital signs reviewed and stable Respiratory status: spontaneous breathing, nonlabored ventilation, respiratory function stable and patient connected to nasal cannula oxygen Cardiovascular status: blood pressure returned to baseline and stable Postop Assessment: no apparent nausea or vomiting Anesthetic complications: no   No notable events documented.   Last Vitals:  Vitals:   01/06/23 1128 01/06/23 1140  BP: 127/87 128/81  Pulse: 97   Resp: 19 16  Temp:    SpO2: 100%     Last Pain:  Vitals:   01/07/23 0744  TempSrc:   PainSc: 0-No pain                 Lenard Simmer

## 2023-01-22 ENCOUNTER — Ambulatory Visit
Admission: EM | Admit: 2023-01-22 | Discharge: 2023-01-22 | Disposition: A | Discharge status: Home / Self Care | Payer: 59 | Attending: Family Medicine | Admitting: Family Medicine

## 2023-01-22 DIAGNOSIS — H66001 Acute suppurative otitis media without spontaneous rupture of ear drum, right ear: Secondary | ICD-10-CM

## 2023-01-22 MED ORDER — CEFDINIR 300 MG PO CAPS: 300.0000 mg | ORAL_CAPSULE | Freq: Two times a day (BID) | ORAL | 0 refills | Status: DC

## 2023-01-22 NOTE — Discharge Instructions (Signed)
Stop by the pharmacy to pick up your prescriptions.  Follow up with your primary care provider to discuss seeing a ear, nose or throat.

## 2023-01-22 NOTE — ED Provider Notes (Signed)
[facility]    CSN: [id number] Arrival date & time: 01/22/23  4010      History   Chief Complaint Chief Complaint  Patient presents with   Otalgia    HPI Diana Contreras is a 47 y.o. female.   HPI   Diana Contreras presents for right ear pain  for the past week. Has been having brownish drainage from the ear for the past week.  Has more moisture in the ear that she noticed.  Has ear congestion. No cold symptoms, tinnitis. Endorses some hearing loss. Feels like her head is in a bucket.     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   Adult BMI 33.0-33.9 kg/sq m 11/26/2022   History of abnormal cervical Pap smear 11/26/2022   History of palpitations 11/26/2022    Past Surgical History:  Procedure Laterality Date   COLONOSCOPY WITH PROPOFOL N/A 01/06/2023   Procedure: COLONOSCOPY WITH PROPOFOL;  Surgeon: Toney Reil, MD;  Location: [facility] ENDOSCOPY;  Service: Gastroenterology;  Laterality: N/A;   NO PAST SURGERIES      OB History     Gravida  2   Para  2   Term  2   Preterm      AB      Living  2      SAB      IAB      Ectopic      Multiple      Live Births  2        Obstetric Comments  Pregnancy induced hypertension          Home Medications    Prior to Admission medications   Medication Sig Start Date End Date Taking? Authorizing Provider  cefdinir (OMNICEF) 300 MG capsule Take 1 capsule (300 mg total) by mouth 2 (two) times daily. 01/22/23  Yes Creek Gan, DO  hydrOXYzine (VISTARIL) 50 MG capsule Take 1 capsule (50 mg total) by mouth once as needed for up to 1 dose. 01/29/22  Yes Mirna Mires, CNM  levonorgestrel-ethinyl  estradiol Erenest Rasher) 0.15-30 MG-MCG tablet TAKE 1 TABLET BY MOUTH EVERY DAY 04/08/22  Yes Mirna Mires, CNM  Semaglutide-Weight Management [facility]) 0.5 MG/0.5ML SOAJ Inject [place] weekly 0.5 mg dosage 12/26/22  Yes Dugal, Tabitha, FNP  valACYclovir (VALTREX) 1000 MG tablet Take two tablets twice a day prn outbreak 11/26/22  Yes Dugal, Wyatt Mage, FNP    Family History Family History  Problem Relation Age of Onset   Hypertension Mother    Diabetes Mother        type 2   Diabetes Father        type 2   Hypertension Father    Heart disease Maternal Grandmother        died from MI   Stroke Maternal Grandmother    Heart attack Maternal Grandmother    Bladder Cancer Maternal Grandfather    Breast cancer Paternal Grandmother 50   Hypertension Paternal Grandmother    Stroke Paternal Grandmother    Diabetes Paternal Uncle        type 1    Social History Social History   Tobacco Use   Smoking status: Never  Smokeless tobacco: Never  Vaping Use   Vaping status: Never Used  Substance Use Topics   Alcohol use: Yes   Drug use: No     Allergies   Patient has no known allergies.   Review of Systems Review of Systems: :negative unless otherwise stated in HPI.      Physical Exam Triage Vital Signs ED Triage Vitals  Encounter Vitals Group     BP 01/22/23 1029 (!) 150/104     Systolic BP Percentile --      Diastolic BP Percentile --      Pulse Rate 01/22/23 1029 80     Resp 01/22/23 1029 16     Temp 01/22/23 1029 98.9 F (37.2 C)     Temp Source 01/22/23 1029 Oral     SpO2 01/22/23 1029 97 %     Weight 01/22/23 1028 182 lb (82.6 kg)     Height 01/22/23 1028 5\' 4"  (1.626 m)     Head Circumference --      Peak Flow --      Pain Score 01/22/23 1031 2     Pain Loc --      Pain Education --      Exclude from Growth Chart --    No data found.  Updated Vital Signs BP (!) 150/104 (BP Location: Right Arm)   Pulse 80   Temp 98.9 F (37.2 C) (Oral)   Resp 16   Ht 5\' 4"   (1.626 m)   Wt 82.6 kg   SpO2 97%   BMI 31.24 kg/m   Visual Acuity Right Eye Distance:   Left Eye Distance:   Bilateral Distance:    Right Eye Near:   Left Eye Near:    Bilateral Near:     Physical Exam GEN:     alert, non-toxic appearing female in no distress    HENT:  mucus membranes moist, no nasal discharge, right TM opaque and erythematous with pustule at the anterolateral TM, left TM normal, normal external auditory canals bilaterally, nontender tragus EYES:  no scleral injection NECK:  normal ROM, no meningismus   RESP:  no increased work of breathing Skin:   warm and dry    UC Treatments / Results  Labs (all labs ordered are listed, but only abnormal results are displayed) Labs Reviewed - No data to display  EKG   Radiology No results found.  Procedures Procedures (including critical care time)  Medications Ordered in UC Medications - No data to display  Initial Impression / Assessment and Plan / UC Course  I have reviewed the triage vital signs and the nursing notes.  Pertinent labs & imaging results that were available during my care of the patient were reviewed by me and considered in my medical decision making (see chart for details).        Acute Otitis media Overall patient is well-appearing, well-hydrated and without respiratory distress. [name] is afebrile.  She has evidence of acute otitis media on the right.  Treat with cefdinir twice daily for 7 days.  Tylenol/Motrin's as needed for fever or discomfort.  Stressed importance of hydration.    Discussed MDM, treatment plan and plan for follow-up with patient who agrees with plan.   Final Clinical Impressions(s) / UC Diagnoses   Final diagnoses:  Non-recurrent acute suppurative otitis media of right ear without spontaneous rupture of tympanic membrane     Discharge Instructions      Stop by the pharmacy to pick up your  prescriptions.  Follow up with your primary care provider to  discuss seeing a ear, nose or throat.      ED Prescriptions     Medication Sig Dispense Auth. Provider   cefdinir (OMNICEF) 300 MG capsule Take 1 capsule (300 mg total) by mouth 2 (two) times daily. 14 capsule Nickolaos Brallier, DO      PDMP not reviewed this encounter.   Katha Cabal, DO 01/22/23 1210

## 2023-01-22 NOTE — ED Triage Notes (Signed)
Pt c/o R ear pain & fullness x1 wk.

## 2023-02-03 ENCOUNTER — Encounter: Payer: Self-pay | Admitting: Obstetrics

## 2023-02-03 ENCOUNTER — Encounter: Payer: Self-pay | Admitting: Family

## 2023-02-03 ENCOUNTER — Ambulatory Visit (INDEPENDENT_AMBULATORY_CARE_PROVIDER_SITE_OTHER): Payer: 59 | Admitting: Obstetrics

## 2023-02-03 ENCOUNTER — Other Ambulatory Visit (HOSPITAL_COMMUNITY)
Admission: RE | Admit: 2023-02-03 | Discharge: 2023-02-03 | Disposition: A | Discharge status: Home / Self Care | Payer: 59 | Source: Ambulatory Visit | Attending: Obstetrics | Admitting: Obstetrics

## 2023-02-03 VITALS — BP 156/94 | HR 89 | Ht 64.0 in | Wt 174.5 lb

## 2023-02-03 DIAGNOSIS — N898 Other specified noninflammatory disorders of vagina: Secondary | ICD-10-CM | POA: Diagnosis present

## 2023-02-03 DIAGNOSIS — Z01419 Encounter for gynecological examination (general) (routine) without abnormal findings: Secondary | ICD-10-CM | POA: Diagnosis not present

## 2023-02-03 DIAGNOSIS — Z1231 Encounter for screening mammogram for malignant neoplasm of breast: Secondary | ICD-10-CM

## 2023-02-03 DIAGNOSIS — Z124 Encounter for screening for malignant neoplasm of cervix: Secondary | ICD-10-CM

## 2023-02-03 DIAGNOSIS — I10 Essential (primary) hypertension: Secondary | ICD-10-CM

## 2023-02-03 MED ORDER — LEVONORGESTREL-ETHINYL ESTRAD 0.15-30 MG-MCG PO TABS: 1.0000 | ORAL_TABLET | Freq: Every day | ORAL | 3 refills | Status: DC

## 2023-02-03 NOTE — Patient Instructions (Signed)
Preventive Care 47-47 Years Old, Female Preventive care refers to lifestyle choices and visits with your health care provider that can promote health and wellness. Preventive care visits are also called wellness exams. What can I expect for my preventive care visit? Counseling Your health care provider may ask you questions about your: Medical history, including: Past medical problems. Family medical history. Pregnancy history. Current health, including: Menstrual cycle. Method of birth control. Emotional well-being. Home life and relationship well-being. Sexual activity and sexual health. Lifestyle, including: Alcohol, nicotine or tobacco, and drug use. Access to firearms. Diet, exercise, and sleep habits. Work and work environment. Sunscreen use. Safety issues such as seatbelt and bike helmet use. Physical exam Your health care provider will check your: Height and weight. These may be used to calculate your BMI (body mass index). BMI is a measurement that tells if you are at a healthy weight. Waist circumference. This measures the distance around your waistline. This measurement also tells if you are at a healthy weight and may help predict your risk of certain diseases, such as type 2 diabetes and high blood pressure. Heart rate and blood pressure. Body temperature. Skin for abnormal spots. What immunizations do I need?  Vaccines are usually given at various ages, according to a schedule. Your health care provider will recommend vaccines for you based on your age, medical history, and lifestyle or other factors, such as travel or where you work. What tests do I need? Screening Your health care provider may recommend screening tests for certain conditions. This may include: Lipid and cholesterol levels. Diabetes screening. This is done by checking your blood sugar (glucose) after you have not eaten for a while (fasting). Pelvic exam and Pap test. Hepatitis B test. Hepatitis C  test. HIV (human immunodeficiency virus) test. STI (sexually transmitted infection) testing, if you are at risk. Lung cancer screening. Colorectal cancer screening. Mammogram. Talk with your health care provider about when you should start having regular mammograms. This may depend on whether you have a family history of breast cancer. BRCA-related cancer screening. This may be done if you have a family history of breast, ovarian, tubal, or peritoneal cancers. Bone density scan. This is done to screen for osteoporosis. Talk with your health care provider about your test results, treatment options, and if necessary, the need for more tests. Follow these instructions at home: Eating and drinking  Eat a diet that includes fresh fruits and vegetables, whole grains, lean protein, and low-fat dairy products. Take vitamin and mineral supplements as recommended by your health care provider. Do not drink alcohol if: Your health care provider tells you not to drink. You are pregnant, may be pregnant, or are planning to become pregnant. If you drink alcohol: Limit how much you have to 0-1 drink a day. Know how much alcohol is in your drink. In the [place] one drink equals one 12 oz bottle of beer (355 mL), one 5 oz glass of wine (148 mL), or one 1 oz glass of hard liquor (44 mL). Lifestyle Brush your teeth every morning and night with fluoride toothpaste. Floss one time each day. Exercise for at least 30 minutes 5 or more days each week. Do not use any products that contain nicotine or tobacco. These products include cigarettes, chewing tobacco, and vaping devices, such as e-cigarettes. If you need help quitting, ask your health care provider. Do not use drugs. If you are sexually active, practice safe sex. Use a condom or other form of protection to   prevent STIs. If you do not wish to become pregnant, use a form of birth control. If you plan to become pregnant, see your health care provider for a  prepregnancy visit. Take aspirin only as told by your health care provider. Make sure that you understand how much to take and what form to take. Work with your health care provider to find out whether it is safe and beneficial for you to take aspirin daily. Find healthy ways to manage stress, such as: Meditation, yoga, or listening to music. Journaling. Talking to a trusted person. Spending time with friends and family. Minimize exposure to UV radiation to reduce your risk of skin cancer. Safety Always wear your seat belt while driving or riding in a vehicle. Do not drive: If you have been drinking alcohol. Do not ride with someone who has been drinking. When you are tired or distracted. While texting. If you have been using any mind-altering substances or drugs. Wear a helmet and other protective equipment during sports activities. If you have firearms in your house, make sure you follow all gun safety procedures. Seek help if you have been physically or sexually abused. What's next? Visit your health care provider once a year for an annual wellness visit. Ask your health care provider how often you should have your eyes and teeth checked. Stay up to date on all vaccines. This information is not intended to replace advice given to you by your health care provider. Make sure you discuss any questions you have with your health care provider. Document Revised: 08/23/2020 Document Reviewed: 08/23/2020 Elsevier Patient Education  2024 Elsevier Inc. Breast Self-Awareness Breast self-awareness is knowing how your breasts look and feel. You need to: Check your breasts on a regular basis. Tell your doctor about any changes. Become familiar with the look and feel of your breasts. This can help you catch a breast problem while it is still small and can be treated. You should do breast self-exams even if you have breast implants. What you need: A mirror. A well-lit room. A pillow or other  soft object. How to do a breast self-exam Follow these steps to do a breast self-exam: Look for changes  Take off all the clothes above your waist. Stand in front of a mirror in a room with good lighting. Put your hands down at your sides. Compare your breasts in the mirror. Look for any difference between them, such as: A difference in shape. A difference in size. Wrinkles, dips, and bumps in one breast and not the other. Look at each breast for changes in the skin, such as: Redness. Scaly areas. Skin that has gotten thicker. Dimpling. Open sores (ulcers). Look for changes in your nipples, such as: Fluid coming out of a nipple. Fluid around a nipple. Bleeding. Dimpling. Redness. A nipple that looks pushed in (retracted), or that has changed position. Feel for changes Lie on your back. Feel each breast. To do this: Pick a breast to feel. Place a pillow under the shoulder closest to that breast. Put the arm closest to that breast behind your head. Feel the nipple area of that breast using the hand of your other arm. Feel the area with the pads of your three middle fingers by making small circles with your fingers. Use light, medium, and firm pressure. Continue the overlapping circles, moving downward over the breast. Keep making circles with your fingers. Stop when you feel your ribs. Start making circles with your fingers again, this time going   upward until you reach your collarbone. Then, make circles outward across your breast and into your armpit area. Squeeze your nipple. Check for discharge and lumps. Repeat these steps to check your other breast. Sit or stand in the tub or shower. With soapy water on your skin, feel each breast the same way you did when you were lying down. Write down what you find Writing down what you find can help you remember what to tell your doctor. Write down: What is normal for each breast. Any changes you find in each breast. These  include: The kind of changes you find. A tender or painful breast. Any lump you find. Write down its size and where it is. When you last had your monthly period (menstrual cycle). General tips If you are breastfeeding, the best time to check your breasts is after you feed your baby or after you use a breast pump. If you get monthly bleeding, the best time to check your breasts is 5-7 days after your monthly cycle ends. With time, you will become comfortable with the self-exam. You will also start to know if there are changes in your breasts. Contact a doctor if: You see a change in the shape or size of your breasts or nipples. You see a change in the skin of your breast or nipples, such as red or scaly skin. You have fluid coming from your nipples that is not normal. You find a new lump or thick area. You have breast pain. You have any concerns about your breast health. Summary Breast self-awareness includes looking for changes in your breasts and feeling for changes within your breasts. You should do breast self-awareness in front of a mirror in a well-lit room. If you get monthly periods (menstrual cycles), the best time to check your breasts is 5-7 days after your period ends. Tell your doctor about any changes you see in your breasts. Changes include changes in size, changes on the skin, painful or tender breasts, or fluid from your nipples that is not normal. This information is not intended to replace advice given to you by your health care provider. Make sure you discuss any questions you have with your health care provider. Document Revised: 08/02/2021 Document Reviewed: 12/28/2020 Elsevier Patient Education  2024 Elsevier Inc.  

## 2023-02-03 NOTE — Progress Notes (Signed)
cer   GYNECOLOGY ANNUAL PHYSICAL EXAM PROGRESS NOTE  Subjective:    Diana Contreras is a 47 y.o. [id number] female who presents for an annual exam.  The patient is sexually active. The patient participates in regular exercise: no. Has the patient ever been transfused or tattooed?: no. The patient reports that there is not domestic violence in her life.  She uses OCPs for birth control. Her husband is having a vasectomy this week, but she would like to continue on her OCPs. 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 She is married and her husband is a Theatre stage manager. She has two grown sons.  She denies an y dysuria; denies perimenopausal symptoms.  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 She uses OCPs and wants to continue this  Menstrual History: Menarche age: 1 Patient's last menstrual period was 01/11/2023 (approximate). Period Cycle (Days): 28 Period Duration (Days): 1-2 Period Pattern: Regular Menstrual Flow: Light Menstrual Control: Tampon Menstrual Control Change Freq (Hours): 4 Dysmenorrhea: None   Gynecologic History:  Contraception: OCP (estrogen/progesterone) History of STI's:  Last Pap: 12/14/19. Results were: normal.  Denies h/o abnormal pap smears. Last mammogram: 06/06/22. Results were: normal       OB History  Gravida Para Term Preterm AB Living  2 2 2  0 0 2  SAB IAB Ectopic Multiple Live Births  0 0 0 0 2    # Outcome Date GA Lbr Len/2nd Weight Sex Type Anes PTL Lv  2 Term 06/16/02   8 lb 11 oz (3.941 kg) M Vag-Spont   LIV  1 Term 06/08/97   8 lb 4 oz (3.742 kg) M Vag-Spont   LIV    Obstetric Comments  Pregnancy induced hypertension    Past Medical History:  Diagnosis Date   Abnormal Pap smear of cervix 10/30/2010   +HPV, pap on 12/10/11 neg/neg; pap 12/2012 neg/neg    Past Surgical History:  Procedure Laterality  Date   COLONOSCOPY WITH PROPOFOL N/A 01/06/2023   Procedure: COLONOSCOPY WITH PROPOFOL;  Surgeon: Toney Reil, MD;  Location: [facility] ENDOSCOPY;  Service: Gastroenterology;  Laterality: N/A;   NO PAST SURGERIES      Family History  Problem Relation Age of Onset   Hypertension Mother    Diabetes Mother        type 2   Diabetes Father        type 2   Hypertension Father    Heart disease Maternal Grandmother        died from MI   Stroke Maternal Grandmother    Heart attack Maternal Grandmother    Bladder Cancer Maternal Grandfather    Breast cancer Paternal Grandmother 73   Hypertension Paternal Grandmother    Stroke Paternal Grandmother    Diabetes Paternal Uncle        type 1    Social History   Socioeconomic History   Marital status: Married    Spouse name: Not on file   Number of children: 2   Years of education: Not on file   Highest education level: Not on file  Occupational History   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    Social Determinants of Health   Financial Resource Strain: Not on file  Food Insecurity: Not on file  Transportation Needs: Not on file  Physical Activity: Inactive (07/01/2017)   Exercise Vital Sign    Days of Exercise per Week: 0 days    Minutes of Exercise per Session: 0 min  Stress: No Stress Concern Present (07/01/2017)   [employer] of Occupational Health - Occupational Stress Questionnaire    Feeling of Stress : Not at all  Social Connections: Not on file   Intimate Partner Violence: Not on file    Current Outpatient Medications on File Prior to Visit  Medication Sig Dispense Refill   hydrOXYzine (VISTARIL) 50 MG capsule Take 1 capsule (50 mg total) by mouth once as needed for up to 1 dose. 5 capsule 0   levonorgestrel-ethinyl estradiol (ALTAVERA) 0.15-30 MG-MCG tablet TAKE 1 TABLET BY MOUTH EVERY DAY 84 tablet 3   Semaglutide-Weight Management (WEGOVY) 0.5 MG/0.5ML SOAJ Inject [place] weekly 0.5 mg dosage 2 mL 2   valACYclovir (VALTREX) 1000 MG tablet Take two tablets twice a day prn outbreak 30 tablet 0   cefdinir (OMNICEF) 300 MG capsule Take 1 capsule (300 mg total) by mouth 2 (two) times daily. (Patient not taking: Reported on 02/03/2023) 14 capsule 0   No current facility-administered medications on file prior to visit.    No Known Allergies   Review of Systems Constitutional: negative for chills, fatigue, fevers and sweats Eyes: negative for irritation, redness and visual disturbance Ears, nose, mouth, throat, and face: negative for hearing loss, nasal congestion, snoring and tinnitus Respiratory: negative for asthma, cough, sputum Cardiovascular: negative for chest pain, dyspnea, exertional chest pressure/discomfort, irregular heart beat, palpitations and syncope Gastrointestinal: negative for abdominal pain, change in bowel habits, nausea and vomiting Genitourinary: negative for abnormal menstrual periods, genital lesions, sexual problems and vaginal discharge, dysuria and urinary incontinence Integument/breast: negative for breast lump, breast tenderness and nipple discharge Hematologic/lymphatic: negative for bleeding and easy bruising Musculoskeletal:negative for back pain and muscle weakness Neurological: negative for dizziness, headaches, vertigo and weakness Endocrine: negative for diabetic symptoms including polydipsia, polyuria and skin dryness Allergic/Immunologic: negative for hay fever and urticaria      Objective:   Blood pressure (!) 142/96, pulse 83, height 5\' 4"  (1.626 m), weight 174 lb 8 oz (79.2 kg), last menstrual period 01/11/2023. Body mass index is 29.95 kg/m.    General Appearance:    Alert, cooperative, no distress, appears stated age  Head:    Normocephalic, without obvious abnormality, atraumatic  Eyes:    PERRL, conjunctiva/corneas clear, EOM's intact, both eyes  Ears:    Normal external ear canals, both ears  Nose:   Nares normal, septum midline, mucosa normal, no drainage or sinus tenderness  Throat:   Lips, mucosa, and tongue normal; teeth and gums normal  Neck:   Supple, symmetrical, trachea midline, no adenopathy; thyroid: no enlargement/tenderness/nodules; no carotid bruit or JVD  Back:     Symmetric, no curvature, ROM normal, no CVA tenderness  Lungs:     Clear to auscultation bilaterally, respirations unlabored  Chest Wall:    No  tenderness or deformity   Heart:    Regular rate and rhythm, S1 and S2 normal, no murmur, rub or gallop  Breast Exam:    No tenderness, masses, or nipple abnormality  Abdomen:     Soft, non-tender, bowel sounds active all four quadrants, no masses, no organomegaly.    Genitalia:    Pelvic:external genitalia normal, vagina without lesions, discharge, or tenderness, rectovaginal septum  normal. Cervix normal in appearance, no cervical motion tenderness, no adnexal masses or tenderness.  Uterus normal size, shape, mobile, regular contours, nontender. Uterus is anteverted.  Rectal:    Normal external sphincter.  No hemorrhoids appreciated. Internal exam not done.   Extremities:   Extremities normal, atraumatic, no cyanosis or edema  Pulses:   2+ and symmetric all extremities  Skin:   Skin color, texture, turgor normal, no rashes or lesions  Lymph nodes:   Cervical, supraclavicular, and axillary nodes normal  Neurologic:   CNII-XII intact, normal strength, sensation and reflexes throughout   .  Labs:  Lab Results  Component Value Date   WBC 7.7  12/14/2019   HGB 13.1 12/14/2019   HCT 40.7 12/14/2019   MCV 87 12/14/2019    Lab Results  Component Value Date   CREATININE 0.94 02/04/2022   BUN 9 02/04/2022   NA 138 02/04/2022   K 4.1 02/04/2022   CL 103 02/04/2022   CO2 23 02/04/2022    Lab Results  Component Value Date   ALT 27 02/04/2022   AST 22 02/04/2022   ALKPHOS 90 02/04/2022   BILITOT 0.4 02/04/2022    Lab Results  Component Value Date   TSH 1.11 11/26/2022     Assessment:   1. Encounter for annual routine gynecological examination   2. Encounter for screening mammogram for malignant neoplasm of breast      Plan:  Blood tests: per her PCP. Breast self exam technique reviewed and patient encouraged to perform self-exam monthly. Contraception: OCP (estrogen/progesterone) and her husband is planning a vasectomy .I have renewed the $x for OCPs. She wants to continue on these even post vasectomy. Discussed healthy lifestyle modifications. Mammogram ordered for next visit- three to six months Pap smear ordered. Flu vaccine:per her PCP Follow up in 1 year for annual exam   Mirna Mires, CNM  02/03/2023 12:03 PM    OB/GYN

## 2023-02-04 LAB — CERVICOVAGINAL ANCILLARY ONLY
Bacterial Vaginitis (gardnerella): NEGATIVE
Candida Glabrata: NEGATIVE
Candida Vaginitis: NEGATIVE
Comment: NEGATIVE
Comment: NEGATIVE
Comment: NEGATIVE

## 2023-02-04 MED ORDER — WEGOVY 1 MG/0.5ML ~~LOC~~ SOAJ: 1.0000 mg | SUBCUTANEOUS | 0 refills | Status: DC

## 2023-02-05 LAB — CYTOLOGY - PAP
Comment: NEGATIVE
Diagnosis: NEGATIVE
High risk HPV: NEGATIVE

## 2023-02-05 MED ORDER — AMLODIPINE BESYLATE 5 MG PO TABS: 5.0000 mg | ORAL_TABLET | Freq: Every day | ORAL | 0 refills | Status: DC

## 2023-02-05 NOTE — Addendum Note (Signed)
Addended by: Mort Sawyers on: 02/05/2023 12:43 PM   Modules accepted: Orders

## 2023-02-17 ENCOUNTER — Other Ambulatory Visit: Payer: Self-pay | Admitting: Family

## 2023-02-17 ENCOUNTER — Ambulatory Visit: Payer: 59 | Admitting: Family

## 2023-02-17 ENCOUNTER — Ambulatory Visit: Payer: 59 | Attending: Family

## 2023-02-17 ENCOUNTER — Encounter: Payer: Self-pay | Admitting: Family

## 2023-02-17 VITALS — BP 150/98 | HR 100 | Temp 97.8°F | Ht 64.0 in | Wt 177.4 lb

## 2023-02-17 DIAGNOSIS — R002 Palpitations: Secondary | ICD-10-CM | POA: Diagnosis not present

## 2023-02-17 DIAGNOSIS — I10 Essential (primary) hypertension: Secondary | ICD-10-CM | POA: Diagnosis not present

## 2023-02-17 DIAGNOSIS — R944 Abnormal results of kidney function studies: Secondary | ICD-10-CM

## 2023-02-17 DIAGNOSIS — R0609 Other forms of dyspnea: Secondary | ICD-10-CM | POA: Diagnosis not present

## 2023-02-17 DIAGNOSIS — R739 Hyperglycemia, unspecified: Secondary | ICD-10-CM | POA: Insufficient documentation

## 2023-02-17 DIAGNOSIS — I498 Other specified cardiac arrhythmias: Secondary | ICD-10-CM | POA: Insufficient documentation

## 2023-02-17 LAB — COMPREHENSIVE METABOLIC PANEL
ALT: 15 U/L (ref 0–35)
AST: 18 U/L (ref 0–37)
Albumin: 4.1 g/dL (ref 3.5–5.2)
Alkaline Phosphatase: 73 U/L (ref 39–117)
BUN: 10 mg/dL (ref 6–23)
CO2: 25 meq/L (ref 19–32)
Calcium: 9.4 mg/dL (ref 8.4–10.5)
Chloride: 102 meq/L (ref 96–112)
Creatinine, Ser: 1.12 mg/dL (ref 0.40–1.20)
GFR: 58.78 mL/min — ABNORMAL LOW (ref >60.00)
Glucose, Bld: 90 mg/dL (ref 70–99)
Potassium: 3.8 meq/L (ref 3.5–5.1)
Sodium: 140 meq/L (ref 135–145)
Total Bilirubin: 0.6 mg/dL (ref 0.2–1.2)
Total Protein: 7 g/dL (ref 6.0–8.3)

## 2023-02-17 LAB — TSH: TSH: 1.43 u[IU]/mL (ref 0.35–5.50)

## 2023-02-17 LAB — CBC
HCT: 40.4 % (ref 36.0–46.0)
Hemoglobin: 12.9 g/dL (ref 12.0–15.0)
MCHC: 32 g/dL (ref 30.0–36.0)
MCV: 85.9 fL (ref 78.0–100.0)
Platelets: 279 10*3/uL (ref 150.0–400.0)
RBC: 4.71 Mil/uL (ref 3.87–5.11)
RDW: 17.1 % — ABNORMAL HIGH (ref 11.5–15.5)
WBC: 7.4 10*3/uL (ref 4.0–10.5)

## 2023-02-17 MED ORDER — ALENDRONATE SODIUM 70 MG PO TABS: 70.0000 mg | ORAL_TABLET | ORAL | 1 refills | Status: DC

## 2023-02-17 MED ORDER — AMLODIPINE BESYLATE 10 MG PO TABS: 10.0000 mg | ORAL_TABLET | Freq: Every day | ORAL | 3 refills | Status: AC

## 2023-02-17 NOTE — Progress Notes (Signed)
Established Patient Office Visit  Subjective:   Patient ID: Diana Contreras, female    DOB: May 01, 1975  Age: 47 y.o. MRN: [id number]  CC:  Chief Complaint  Patient presents with   Medical Management of Chronic Issues    BP follow up    HPI: Diana Contreras is a 47 y.o. female presenting on 02/17/2023 for Medical Management of Chronic Issues (BP follow up)  HTN: confirmed. Has recently started amlodipine 5 mg once daily.  Average since starting the medication . She does report racing heart/palpitations but mainly only at night time. She states she sometimes thinks it might be more related to menopausal symptoms vs stress. Over the summer had some DOE while walking, but with weight loss from GLP 1 has improved. She does eat often on the go but she does try to pick grilled food but not always is it that way. She does salt on top of her regular food. Both her parents have hypertension but states does not think she has family history MI and or heart disease prior to age 35.   Still average 142/98, 153/97, 153/107 after starting amlodipine. States heart rate when she checks her blood pressure is around 80's  Does admit to caffeine in the morning, water in the day and sweet tea in the evening. Has slowed back on alcohol.       ROS: Negative unless specifically indicated above in HPI.   Relevant past medical history reviewed and updated as indicated.   Allergies and medications reviewed and updated.   Current Outpatient Medications:    amLODipine (NORVASC) 10 MG tablet, Take 1 tablet (10 mg total) by mouth daily., Disp: 90 tablet, Rfl: 3   hydrOXYzine (VISTARIL) 50 MG capsule, Take 1 capsule (50 mg total) by mouth once as needed for up to 1 dose., Disp: 5 capsule, Rfl: 0   levonorgestrel-ethinyl estradiol (ALTAVERA) 0.15-30 MG-MCG tablet, Take 1 tablet by mouth daily., Disp: 84 tablet, Rfl: 3   Semaglutide-Weight Management (WEGOVY) 1 MG/0.5ML SOAJ, Inject 1 mg into the skin once a  week., Disp: 6 mL, Rfl: 0   valACYclovir (VALTREX) 1000 MG tablet, Take two tablets twice a day prn outbreak, Disp: 30 tablet, Rfl: 0  No Known Allergies  Objective:   BP (!) 150/98 (BP Location: Left Arm, Patient Position: Sitting, Cuff Size: Normal)   Pulse 100   Temp 97.8 F (36.6 C) (Temporal)   Ht 5\' 4"  (1.626 m)   Wt 177 lb 6.4 oz (80.5 kg)   LMP 01/11/2023 (Approximate)   SpO2 97%   BMI 30.45 kg/m    Physical Exam Constitutional:      General: She is not in acute distress.    Appearance: Normal appearance. She is normal weight. She is not ill-appearing, toxic-appearing or diaphoretic.  HENT:     Head: Normocephalic.  Cardiovascular:     Rate and Rhythm: Normal rate and regular rhythm.  Pulmonary:     Effort: Pulmonary effort is normal.     Breath sounds: Normal breath sounds.  Musculoskeletal:        General: Normal range of motion.     Right lower leg: No edema.     Left lower leg: No edema.  Neurological:     General: No focal deficit present.     Mental Status: She is alert and oriented to person, place, and time. Mental status is at baseline.  Psychiatric:        Mood and Affect: Mood normal.  Behavior: Behavior normal.        Thought Content: Thought content normal.        Judgment: Judgment normal.    Wt Readings from Last 3 Encounters:  02/17/23 177 lb 6.4 oz (80.5 kg)  02/03/23 174 lb 8 oz (79.2 kg)  01/22/23 182 lb (82.6 kg)    Assessment & Plan:  Essential (primary) hypertension Assessment & Plan: Not improving with addition of amlodipine 5 mg  Increase to 10 mg once daily.  Ordering tsh cbc and cmp r/o ddx electrolyte abn, thyroid disease and or kidney issues/anemia Maintain low sodium diet.  Measure blood pressure twice daily with close f/u 10 days.  Orders: -     EKG 12-Lead -     Comprehensive metabolic panel -     CBC -     TSH -     amLODIPine Besylate; Take 1 tablet (10 mg total) by mouth daily.  Dispense: 90 tablet; Refill:  3 -     Ambulatory referral to Cardiology  Palpitations Assessment & Plan: Zio monitor ordered Decrease/avoid caffeine, drink water intake goal 90 oz daily  Ordering tsh and cbc ddx anemia vs thyroid disease Sinus arrhythmia rate 95 bpm on EKG  Referral placed for cardiology to evaluate further, arrhythmia, palpitations, resistant htn Discussed red flag symptoms and when to seek more urgent care  Orders: -     EKG 12-Lead -     Comprehensive metabolic panel -     CBC -     TSH -     EKG 12-Lead -     Ambulatory referral to Cardiology -     LONG TERM MONITOR (3-14 DAYS); Future  DOE (dyspnea on exertion) -     EKG 12-Lead -     Ambulatory referral to Cardiology  Hyperglycemia -     Hemoglobin A1c; Future  Sinus arrhythmia -     Ambulatory referral to Cardiology -     LONG TERM MONITOR (3-14 DAYS); Future     Follow up plan: Return in about 10 days (around 02/27/2023) for f/u blood pressure.  Diana Sawyers, FNP

## 2023-02-17 NOTE — Patient Instructions (Addendum)
  A referral was placed today for urgent cardiology referral.  Please let us know if you have not heard back within 2 weeks about the referral.  Regards,   Swade Shonka FNP-C

## 2023-02-17 NOTE — Assessment & Plan Note (Signed)
Not improving with addition of amlodipine 5 mg  Increase to 10 mg once daily.  Ordering tsh cbc and cmp r/o ddx electrolyte abn, thyroid disease and or kidney issues/anemia Maintain low sodium diet.  Measure blood pressure twice daily with close f/u 10 days.

## 2023-02-17 NOTE — Assessment & Plan Note (Addendum)
Zio monitor ordered Decrease/avoid caffeine, drink water intake goal 90 oz daily  Ordering tsh and cbc ddx anemia vs thyroid disease Sinus arrhythmia rate 95 bpm on EKG  Referral placed for cardiology to evaluate further, arrhythmia, palpitations, resistant htn Discussed red flag symptoms and when to seek more urgent care

## 2023-02-22 DIAGNOSIS — R002 Palpitations: Secondary | ICD-10-CM

## 2023-02-22 DIAGNOSIS — I498 Other specified cardiac arrhythmias: Secondary | ICD-10-CM

## 2023-02-26 NOTE — Progress Notes (Unsigned)
Cardiology Office Note:    Date:  02/27/2023   ID:  Diana Contreras, Diana Contreras 03-25-1975, MRN [id number]  PCP:  Diana Sawyers, FNP  Cardiologist:  None  Electrophysiologist:  None   Referring MD: Diana Sawyers, FNP   Chief Complaint  Patient presents with   Palpitations    History of Present Illness:    Diana Contreras is a 47 y.o. female with a hx of hypertension who is referred by Diana Sawyers, NP for evaluation of palpitations and hypertension.  She reports she has occasional palpitations, particularly seems to occur at night and after she drinks alcohol.  States it feels like heart is racing, can last up to couple minutes.  Occurs about once every 2 to 3 weeks.  She is wearing a ZIO monitor.  She denies any chest pain or dyspnea.  Does not exercise regularly.  Reports occasional lightheadedness but denies any syncope.  She has lost 20 pounds over the last few months.  No smoking history.  No family history of heart disease in her immediate family.   Past Medical History:  Diagnosis Date   Abnormal Pap smear of cervix 10/30/2010   +HPV, pap on 12/10/11 neg/neg; pap 12/2012 neg/neg    Past Surgical History:  Procedure Laterality Date   COLONOSCOPY WITH PROPOFOL N/A 01/06/2023   Procedure: COLONOSCOPY WITH PROPOFOL;  Surgeon: Toney Reil, MD;  Location: [facility] ENDOSCOPY;  Service: Gastroenterology;  Laterality: N/A;   NO PAST SURGERIES      Current Medications: Current Meds  Medication Sig   amLODipine (NORVASC) 10 MG tablet Take 1 tablet (10 mg total) by mouth daily.   hydrOXYzine (VISTARIL) 50 MG capsule Take 1 capsule (50 mg total) by mouth once as needed for up to 1 dose.   levonorgestrel-ethinyl estradiol (ALTAVERA) 0.15-30 MG-MCG tablet Take 1 tablet by mouth daily.   Semaglutide-Weight Management (WEGOVY) 1 MG/0.5ML SOAJ Inject 1 mg into the skin once a week.   valACYclovir (VALTREX) 1000 MG tablet Take two tablets twice a day prn outbreak     Allergies:    Patient has no known allergies.   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    Social Drivers of Corporate investment banker Strain: Not on file  Food Insecurity: Not on file  Transportation Needs: Not on file  Physical Activity: Inactive (07/01/2017)   Exercise Vital Sign    Days of Exercise per Week: 0 days    Minutes of Exercise per Session: 0 min  Stress: No Stress Concern Present (07/01/2017)   [employer] of Occupational Health - Occupational Stress Questionnaire    Feeling of Stress :  Not at all  Social Connections: Not on file     Family History: The patient's family history includes Bladder Cancer in her maternal grandfather; Breast cancer (age of onset: 27) in her paternal grandmother; Diabetes in her father, mother, and paternal uncle; Heart attack in her maternal grandmother; Heart disease in her maternal grandmother; Hypertension in her father, mother, and paternal grandmother; Stroke in her maternal grandmother and paternal grandmother.  ROS:   Please see the history of present illness.     All other systems reviewed and are negative.  EKGs/Labs/Other Studies Reviewed:    The following studies were reviewed today: ED  EKG:   02/17/2023: Normal sinus rhythm, rate 93  Recent  Labs: 02/17/2023: ALT 15; BUN 10; Creatinine, Ser 1.12; Hemoglobin 12.9; Platelets 279.0; Potassium 3.8; Sodium 140; TSH 1.43  Recent Lipid Panel    Component Value Date/Time   CHOL 190 11/26/2022 0956   CHOL 201 (H) 02/04/2022 0945   TRIG 124.0 11/26/2022 0956   HDL 60.40 11/26/2022 0956   HDL 64 02/04/2022 0945   CHOLHDL 3 11/26/2022 0956   VLDL 24.8 11/26/2022 0956   LDLCALC 105 (H) 11/26/2022 0956   LDLCALC 119 (H) 02/04/2022 0945    Physical Exam:    VS:  BP (!) 132/90   Pulse (!) 122   Ht 5\' 4"  (1.626 m)   Wt 175 lb (79.4 kg)   LMP 01/11/2023 (Approximate)   SpO2 98%   BMI 30.04 kg/m     Wt Readings from Last 3 Encounters:  02/27/23 175 lb (79.4 kg)  02/17/23 177 lb 6.4 oz (80.5 kg)  02/03/23 174 lb 8 oz (79.2 kg)     GEN:  Well nourished, well developed in no acute distress HEENT: Normal NECK: No JVD; No carotid bruits LYMPHATICS: No lymphadenopathy CARDIAC: RRR, no murmurs, rubs, gallops RESPIRATORY:  Clear to auscultation without rales, wheezing or rhonchi  ABDOMEN: Soft, non-tender, non-distended MUSCULOSKELETAL:  No edema; No deformity  SKIN: Warm and dry NEUROLOGIC:  Alert and oriented x 3 PSYCHIATRIC:  Normal affect   ASSESSMENT:    1. Palpitations   2. Essential hypertension   3. Obesity (BMI 30-39.9)    PLAN:    Palpitations: Description concerning for arrhythmia, she is currently wearing ZIO monitor.  Will follow-up results.  Check echocardiogram to rule out structural heart disease  Hypertension: On amlodipine 10 mg daily.  BP mildly elevated in clinic today, she just increased amlodipine last week.  Asked to check BP twice daily for next 2 weeks and let us know results  Obesity: Body mass index is 30.04 kg/m.  On Wegovy.  Reports has lost 20 pounds over the last few months   RTC in 4 months  Medication Adjustments/Labs and Tests Ordered: Current medicines are reviewed at length with the patient today.  Concerns regarding medicines are  outlined above.  Orders Placed This Encounter  Procedures   ECHOCARDIOGRAM COMPLETE   No orders of the defined types were placed in this encounter.   Patient Instructions  Medication Instructions:  Continue same medications *If you need a refill on your cardiac medications before your next appointment, please call your pharmacy*   Lab Work: None ordered   Testing/Procedures: Echo schedule in [place] first available   Follow-Up: At [facility], you and your health needs are our priority.  As part of our continuing mission to provide you with exceptional heart care, we have created designated Provider Care Teams.  These Care Teams include your  primary Cardiologist (physician) and Advanced Practice Providers (APPs -  Physician Assistants and Nurse Practitioners) who all work together to provide you with the care you need, when you need it.  We recommend signing up for the patient portal called "MyChart".  Sign up information is provided on this After Visit Summary.  MyChart is used to connect with patients for Virtual Visits (Telemedicine).  Patients are able to view lab/test results, encounter notes, upcoming appointments, etc.  Non-urgent messages can be sent to your provider as well.   To learn more about what you can do with MyChart, go to [email or web address].    Your next appointment:  4 months    Provider:  Dr.Analaya Hoey        Check blood pressure twice a day for 2 weeks.Send readings via [employer], Little Ishikawa, MD  02/27/2023 9:29 AM    [facility]

## 2023-02-27 ENCOUNTER — Encounter: Payer: Self-pay | Admitting: Cardiology

## 2023-02-27 ENCOUNTER — Ambulatory Visit: Payer: 59 | Attending: Cardiology | Admitting: Cardiology

## 2023-02-27 VITALS — BP 132/90 | HR 122 | Ht 64.0 in | Wt 175.0 lb

## 2023-02-27 DIAGNOSIS — E669 Obesity, unspecified: Secondary | ICD-10-CM

## 2023-02-27 DIAGNOSIS — R002 Palpitations: Secondary | ICD-10-CM

## 2023-02-27 DIAGNOSIS — I10 Essential (primary) hypertension: Secondary | ICD-10-CM

## 2023-02-27 NOTE — Patient Instructions (Signed)
Medication Instructions:  Continue same medications *If you need a refill on your cardiac medications before your next appointment, please call your pharmacy*   Lab Work: None ordered   Testing/Procedures: Echo schedule in [place] first available   Follow-Up: At [facility], you and your health needs are our priority.  As part of our continuing mission to provide you with exceptional heart care, we have created designated Provider Care Teams.  These Care Teams include your primary Cardiologist (physician) and Advanced Practice Providers (APPs -  Physician Assistants and Nurse Practitioners) who all work together to provide you with the care you need, when you need it.  We recommend signing up for the patient portal called "MyChart".  Sign up information is provided on this After Visit Summary.  MyChart is used to connect with patients for Virtual Visits (Telemedicine).  Patients are able to view lab/test results, encounter notes, upcoming appointments, etc.  Non-urgent messages can be sent to your provider as well.   To learn more about what you can do with MyChart, go to [email or web address].    Your next appointment:  4 months    Provider:  Dr.Schumann        Check blood pressure twice a day for 2 weeks.Send readings via [employer]

## 2023-02-28 ENCOUNTER — Other Ambulatory Visit: Payer: Self-pay | Admitting: Cardiology

## 2023-02-28 DIAGNOSIS — E669 Obesity, unspecified: Secondary | ICD-10-CM

## 2023-02-28 DIAGNOSIS — R002 Palpitations: Secondary | ICD-10-CM

## 2023-02-28 DIAGNOSIS — I10 Essential (primary) hypertension: Secondary | ICD-10-CM

## 2023-03-04 ENCOUNTER — Other Ambulatory Visit: Payer: Self-pay | Admitting: Cardiology

## 2023-03-04 DIAGNOSIS — R002 Palpitations: Secondary | ICD-10-CM

## 2023-03-04 DIAGNOSIS — I10 Essential (primary) hypertension: Secondary | ICD-10-CM

## 2023-03-04 DIAGNOSIS — E669 Obesity, unspecified: Secondary | ICD-10-CM

## 2023-03-13 ENCOUNTER — Other Ambulatory Visit: Payer: Self-pay | Admitting: Cardiology

## 2023-03-13 DIAGNOSIS — R002 Palpitations: Secondary | ICD-10-CM

## 2023-03-13 DIAGNOSIS — E669 Obesity, unspecified: Secondary | ICD-10-CM

## 2023-03-13 DIAGNOSIS — I10 Essential (primary) hypertension: Secondary | ICD-10-CM

## 2023-03-13 DIAGNOSIS — R0600 Dyspnea, unspecified: Secondary | ICD-10-CM

## 2023-03-14 ENCOUNTER — Ambulatory Visit: Payer: 59 | Attending: Cardiology

## 2023-03-14 DIAGNOSIS — R0600 Dyspnea, unspecified: Secondary | ICD-10-CM

## 2023-03-14 LAB — ECHOCARDIOGRAM COMPLETE
AR max vel: 2.36 cm2
AV Area VTI: 2.53 cm2
AV Area mean vel: 2.37 cm2
AV Mean grad: 4 mm[Hg]
AV Peak grad: 8.2 mm[Hg]
Ao pk vel: 1.43 m/s
Area-P 1/2: 3.31 cm2
Calc EF: 54.3 %
S' Lateral: 2.9 cm
Single Plane A2C EF: 54.6 %
Single Plane A4C EF: 54 %

## 2023-05-03 ENCOUNTER — Other Ambulatory Visit: Payer: Self-pay | Admitting: Family

## 2023-05-03 DIAGNOSIS — I10 Essential (primary) hypertension: Secondary | ICD-10-CM

## 2023-06-09 ENCOUNTER — Encounter: Payer: Self-pay | Admitting: Family

## 2023-06-11 ENCOUNTER — Ambulatory Visit: Payer: 59 | Admitting: Cardiology

## 2023-07-13 NOTE — Progress Notes (Deleted)
 Cardiology Office Note:    Date:  07/13/2023   ID:  Diana Contreras, Buchman 03-05-1976, MRN [id number]  PCP:  Felicita Horns, FNP  Cardiologist:  Wendie Hamburg, MD { Click to update primary MD,subspecialty MD or APP then REFRESH:1}    Referring MD: Felicita Horns, FNP   Chief Complaint: follow-up of palpitations   History of Present Illness:    Diana Contreras is a 48 y.o. female with a history of palpitations with rare PACs/ PVCs noted on monitor in 02/2023, hypertension, and obesity who is followed by Dr. Alda Amas and presents today for follow-up of palpitations.   Patient was referred to Dr. Alda Amas in 02/2023 for further evaluation of palpitations and hypertension. She reported occasional palpitations, particularly at night and after drinking alcohol. She was already wearing a Zio monitor which came back and showed rare PACs and PVCs but no significant arrhythmias including atrial fibrillation. Echo was ordered at that visit and showed LVEF of 55-60% with normal wall motion and diastolic parameters, normal RV function, and no significant valvular disease.   Patient presents today for follow-up. ***  Palpitations Patient has a history of palpitations. Monitor in 02/2023 showed rare PACs and PVCs but no significant arrhythmias including atrial fibrillation. - ***  Hypertension BP well controlled. *** - Continue Amlodipine  10mg  daily.  Obesity BMI *** - On Wegovy .  - ***  EKGs/Labs/Other Studies Reviewed:    The following studies were reviewed:  Zio Monitor 02/22/2023 to 03/08/2023: HR 56 - 171, average 98 bpm. Rare supraventricular and ventricular ectopy. No atrial fibrillation. No sustained arrhythmias. _______________  Echocardiogram 03/14/2023: Impressions: 1. Left ventricular ejection fraction, by estimation, is 55 to 60%. The  left ventricle has normal function. The left ventricle has no regional  wall motion abnormalities. Left ventricular diastolic  parameters were  normal.   2. Right ventricular systolic function is normal. The right ventricular  size is normal.   3. The mitral valve is normal in structure. No evidence of mitral valve  regurgitation.   4. The aortic valve is tricuspid. Aortic valve regurgitation is not  visualized.   5. The inferior vena cava is normal in size with greater than 50%  respiratory variability, suggesting right atrial pressure of 3 mmHg.   EKG:  EKG not ordered today.   Recent Labs: 02/17/2023: ALT 15; BUN 10; Creatinine, Ser 1.12; Hemoglobin 12.9; Platelets 279.0; Potassium 3.8; Sodium 140; TSH 1.43  Recent Lipid Panel    Component Value Date/Time   CHOL 190 11/26/2022 0956   CHOL 201 (H) 02/04/2022 0945   TRIG 124.0 11/26/2022 0956   HDL 60.40 11/26/2022 0956   HDL 64 02/04/2022 0945   CHOLHDL 3 11/26/2022 0956   VLDL 24.8 11/26/2022 0956   LDLCALC 105 (H) 11/26/2022 0956   LDLCALC 119 (H) 02/04/2022 0945    Physical Exam:    Vital Signs: There were no vitals taken for this visit.    Wt Readings from Last 3 Encounters:  02/27/23 175 lb (79.4 kg)  02/17/23 177 lb 6.4 oz (80.5 kg)  02/03/23 174 lb 8 oz (79.2 kg)     General: 48 y.o. female in no acute distress. HEENT: Normocephalic and atraumatic. Sclera clear.  Neck: Supple. No carotid bruits. No JVD. Heart: *** RRR. Distinct S1 and S2. No murmurs, gallops, or rubs.  Lungs: No increased work of breathing. Clear to ausculation bilaterally. No wheezes, rhonchi, or rales.  Abdomen: Soft, non-distended, and non-tender to palpation.  Extremities: No lower  extremity edema.  Radial and distal pedal pulses 2+ and equal bilaterally. Skin: Warm and dry. Neuro: No focal deficits. Psych: Normal affect. Responds appropriately.   Assessment:    No diagnosis found.  Plan:     Disposition: Follow up in ***   Signed, Casimer Clear, PA-C  07/13/2023 8:58 PM    [facility]

## 2023-07-22 ENCOUNTER — Encounter: Payer: Self-pay | Admitting: Student

## 2023-07-23 ENCOUNTER — Ambulatory Visit: Admitting: Student

## 2023-08-07 ENCOUNTER — Other Ambulatory Visit: Payer: Self-pay | Admitting: Family

## 2023-08-07 DIAGNOSIS — Z1231 Encounter for screening mammogram for malignant neoplasm of breast: Secondary | ICD-10-CM

## 2023-09-18 ENCOUNTER — Ambulatory Visit

## 2023-09-19 ENCOUNTER — Ambulatory Visit
Admission: RE | Admit: 2023-09-19 | Discharge: 2023-09-19 | Disposition: A | Discharge status: Home / Self Care | Source: Ambulatory Visit | Attending: Family

## 2023-09-19 DIAGNOSIS — Z1231 Encounter for screening mammogram for malignant neoplasm of breast: Secondary | ICD-10-CM | POA: Diagnosis present

## 2023-09-26 ENCOUNTER — Ambulatory Visit: Payer: Self-pay | Admitting: Family

## 2023-10-30 ENCOUNTER — Telehealth: Payer: Self-pay | Admitting: Family

## 2023-10-30 NOTE — Telephone Encounter (Signed)
 Called  pt and schedule a appt for cpe

## 2023-10-30 NOTE — Telephone Encounter (Signed)
 Please r/s physical I recently had an issue with the schedule overbooking my session limits and this was one that was added during that time frame.   Can we please call to reschedule to when next physical availability is?

## 2023-12-11 ENCOUNTER — Encounter: Admitting: Family

## 2024-02-10 ENCOUNTER — Ambulatory Visit
Admission: RE | Admit: 2024-02-10 | Discharge: 2024-02-10 | Disposition: A | Discharge status: Home / Self Care | Source: Ambulatory Visit | Attending: Emergency Medicine | Admitting: Emergency Medicine

## 2024-02-10 VITALS — BP 129/82 | HR 122 | Temp 101.2°F | Resp 20

## 2024-02-10 DIAGNOSIS — J039 Acute tonsillitis, unspecified: Secondary | ICD-10-CM | POA: Insufficient documentation

## 2024-02-10 DIAGNOSIS — J029 Acute pharyngitis, unspecified: Secondary | ICD-10-CM | POA: Diagnosis present

## 2024-02-10 LAB — POC COVID19/FLU A&B COMBO
Covid Antigen, POC: NEGATIVE
Influenza A Antigen, POC: NEGATIVE
Influenza B Antigen, POC: NEGATIVE

## 2024-02-10 LAB — POCT RAPID STREP A (OFFICE): Rapid Strep A Screen: NEGATIVE

## 2024-02-10 MED ORDER — ACETAMINOPHEN 325 MG PO TABS
975.0000 mg | ORAL_TABLET | Freq: Once | ORAL | Status: AC
  Administered 2024-02-10: 975 mg via ORAL

## 2024-02-10 MED ORDER — AMOXICILLIN-POT CLAVULANATE 875-125 MG PO TABS: 1.0000 | ORAL_TABLET | Freq: Two times a day (BID) | ORAL | 0 refills | Status: DC

## 2024-02-10 NOTE — ED Provider Notes (Signed)
 [facility]    CSN: [id number] Arrival date & time: 02/10/24  1540      History   Chief Complaint Chief Complaint  Patient presents with   Sore Throat    Body aches, headache - Entered by patient   Generalized Body Aches   Fever    HPI Diana Contreras is a 48 y.o. female.   Patient presents for evaluation of a subjective fever, generalized bodyaches and sore throat present for 1 day.  Associated right sided ear fullness.  Decreased appetite due to pain with swallowing, able to tolerate fluids.  Possible sick contacts during the holiday weekend.  Has attempted use of Alka-Seltzer cold and flu.  Denies congestion or cough. 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   Adult BMI 33.0-33.9 kg/sq m 11/26/2022   History of abnormal cervical Pap smear 11/26/2022   History of palpitations 11/26/2022    Past Surgical History:  Procedure Laterality Date   COLONOSCOPY WITH PROPOFOL  N/A 01/06/2023   Procedure: COLONOSCOPY WITH PROPOFOL ;  Surgeon: Unk Corinn Skiff, MD;  Location: [facility] ENDOSCOPY;  Service: Gastroenterology;  Laterality: N/A;   NO PAST SURGERIES      OB History     Gravida  2   Para  2   Term  2   Preterm      AB      Living  2      SAB      IAB      Ectopic      Multiple      Live Births  2        Obstetric Comments  Pregnancy induced hypertension          Home Medications    Prior to Admission medications   Medication Sig Start Date End Date Taking? Authorizing Provider  amLODipine  (NORVASC ) 10 MG tablet Take 1 tablet (10 mg total) by  mouth daily. 02/17/23   Dugal, Tabitha, FNP  hydrOXYzine  (VISTARIL ) 50 MG capsule Take 1 capsule (50 mg total) by mouth once as needed for up to 1 dose. 01/29/22   Carlin Rollene HERO, CNM  levonorgestrel -ethinyl estradiol (ALTAVERA) 0.15-30 MG-MCG tablet Take 1 tablet by mouth daily. 02/03/23   Carlin Rollene HERO, CNM  Semaglutide -Weight Management (WEGOVY ) 1 MG/0.5ML SOAJ Inject 1 mg into the skin once a week. 02/04/23   Corwin Antu, FNP  valACYclovir  (VALTREX ) 1000 MG tablet Take two tablets twice a day prn outbreak 11/26/22   Corwin Antu, FNP    Family History Family History  Problem Relation Age of Onset   Hypertension Mother    Diabetes Mother        type 2   Diabetes Father        type 2   Hypertension Father    Heart disease Maternal Grandmother        died from MI   Stroke Maternal Grandmother    Heart attack Maternal Grandmother    Bladder Cancer Maternal Grandfather    Breast cancer Paternal Grandmother 63   Hypertension Paternal Grandmother    Stroke Paternal Grandmother  Diabetes Paternal Uncle        type 1    Social History Social History   Tobacco Use   Smoking status: Never   Smokeless tobacco: Never  Vaping Use   Vaping status: Never Used  Substance Use Topics   Alcohol use: Yes   Drug use: No     Allergies   Patient has no known allergies.   Review of Systems Review of Systems  Constitutional:  Positive for fever. Negative for activity change, appetite change, chills, diaphoresis, fatigue and unexpected weight change.  HENT:  Positive for sore throat. Negative for congestion, dental problem, drooling, ear discharge, ear pain, facial swelling, hearing loss, mouth sores, nosebleeds, postnasal drip, rhinorrhea, sinus pressure, sinus pain, sneezing, tinnitus, trouble swallowing and voice change.      Physical Exam Triage Vital Signs ED Triage Vitals  Encounter Vitals Group     BP 02/10/24 1601 129/82     Girls Systolic BP Percentile --       Girls Diastolic BP Percentile --      Boys Systolic BP Percentile --      Boys Diastolic BP Percentile --      Pulse Rate 02/10/24 1601 (!) 122     Resp 02/10/24 1601 20     Temp 02/10/24 1601 (!) 101.2 F (38.4 C)     Temp Source 02/10/24 1601 Oral     SpO2 02/10/24 1601 97 %     Weight --      Height --      Head Circumference --      Peak Flow --      Pain Score 02/10/24 1605 4     Pain Loc --      Pain Education --      Exclude from Growth Chart --    No data found.  Updated Vital Signs BP 129/82 (BP Location: Left Arm)   Pulse (!) 122   Temp (!) 101.2 F (38.4 C) (Oral)   Resp 20   LMP 02/06/2024 (Exact Date)   SpO2 97%   Visual Acuity Right Eye Distance:   Left Eye Distance:   Bilateral Distance:    Right Eye Near:   Left Eye Near:    Bilateral Near:     Physical Exam Constitutional:      Appearance: She is well-developed.  HENT:     Head: Normocephalic.     Right Ear: Tympanic membrane and ear canal normal.     Left Ear: Tympanic membrane and ear canal normal.     Nose: No congestion.     Mouth/Throat:     Pharynx: No posterior oropharyngeal erythema.     Tonsils: No tonsillar exudate. 3+ on the right. 3+ on the left.  Cardiovascular:     Rate and Rhythm: Normal rate and regular rhythm.     Heart sounds: Normal heart sounds.  Pulmonary:     Effort: Pulmonary effort is normal.  Musculoskeletal:     Cervical back: Normal range of motion.  Lymphadenopathy:     Cervical: Cervical adenopathy present.  Neurological:     Mental Status: She is alert.      UC Treatments / Results  Labs (all labs ordered are listed, but only abnormal results are displayed) Labs Reviewed  POCT RAPID STREP A (OFFICE)  POC COVID19/FLU A&B COMBO    EKG   Radiology No results found.  Procedures Procedures (including critical care time)  Medications Ordered in UC Medications  acetaminophen (TYLENOL)  tablet 975 mg (975 mg Oral Given 02/10/24 1612)    Initial  Impression / Assessment and Plan / UC Course  I have reviewed the triage vital signs and the nursing notes.  Pertinent labs & imaging results that were available during my care of the patient were reviewed by me and considered in my medical decision making (see chart for details).  Acute Tonsillitis, sore throat  Fever 101.2 with associated tachycardia noted in triage, given 975 mg of Tylenol, tonsillar adenopathy with exudate without erythema present on exam, COVID flu and strep testing negative, strep sent to culture, empirically placed on Augmentin based on presentation of the throat, recommended over-the-counter medications and nonpharmacological supportive measures with follow-up as needed Final Clinical Impressions(s) / UC Diagnoses   Final diagnoses:  Sore throat   Discharge Instructions   None    ED Prescriptions   None    PDMP not reviewed this encounter.   Jaida Shelba SAUNDERS, NP 02/10/24 1721

## 2024-02-10 NOTE — ED Triage Notes (Signed)
 Patient reports fever, sore throat and body aches that started yesterday. Patient to Alka seltzer cold plus at 9:00 am with mild relief. Rates sore throat pain 4/10 and body aches 4/10.

## 2024-02-10 NOTE — Discharge Instructions (Signed)
 COVID flu and strep test negative, strep test has been sent to the lab indeterminate bacterial growth and you will be notified if this occurs  On your exam while your throat is not red tonsils are enlarged and there are Ayris Carano patches and you are currently experience a fever therefore as a precaution you have been placed on antibiotics  Take Augmentin twice daily for 7 days for treatment  You can take Tylenol and/or Ibuprofen as needed for fever reduction and pain relief.    For sore throat: try warm salt water gargles, cepacol lozenges, throat spray, warm tea or water with lemon/honey, popsicles or ice, or OTC cold relief medicine for throat discomfort.   It is important to stay hydrated: drink plenty of fluids (water, gatorade/powerade/pedialyte, juices, or teas) to keep your throat moisturized and help further relieve irritation/discomfort.

## 2024-02-11 ENCOUNTER — Ambulatory Visit

## 2024-02-12 LAB — CULTURE, GROUP A STREP (THRC)

## 2024-02-13 ENCOUNTER — Ambulatory Visit (HOSPITAL_COMMUNITY): Payer: Self-pay

## 2024-02-27 ENCOUNTER — Other Ambulatory Visit: Payer: Self-pay | Admitting: Family

## 2024-02-27 DIAGNOSIS — I10 Essential (primary) hypertension: Secondary | ICD-10-CM

## 2024-03-01 ENCOUNTER — Encounter: Admitting: Family

## 2024-03-03 ENCOUNTER — Other Ambulatory Visit: Payer: Self-pay | Admitting: Family

## 2024-03-03 ENCOUNTER — Encounter: Payer: Self-pay | Admitting: Family

## 2024-03-03 ENCOUNTER — Ambulatory Visit: Admitting: Family

## 2024-03-03 VITALS — BP 139/90 | HR 87 | Temp 98.0°F | Ht 64.0 in | Wt 185.0 lb

## 2024-03-03 DIAGNOSIS — Z6831 Body mass index (BMI) 31.0-31.9, adult: Secondary | ICD-10-CM | POA: Diagnosis not present

## 2024-03-03 DIAGNOSIS — I10 Essential (primary) hypertension: Secondary | ICD-10-CM

## 2024-03-03 DIAGNOSIS — E6609 Other obesity due to excess calories: Secondary | ICD-10-CM

## 2024-03-03 DIAGNOSIS — I498 Other specified cardiac arrhythmias: Secondary | ICD-10-CM | POA: Diagnosis not present

## 2024-03-03 DIAGNOSIS — Z01419 Encounter for gynecological examination (general) (routine) without abnormal findings: Secondary | ICD-10-CM

## 2024-03-03 DIAGNOSIS — E7800 Pure hypercholesterolemia, unspecified: Secondary | ICD-10-CM | POA: Diagnosis not present

## 2024-03-03 DIAGNOSIS — Z23 Encounter for immunization: Secondary | ICD-10-CM

## 2024-03-03 DIAGNOSIS — B009 Herpesviral infection, unspecified: Secondary | ICD-10-CM

## 2024-03-03 DIAGNOSIS — E66811 Obesity, class 1: Secondary | ICD-10-CM | POA: Diagnosis not present

## 2024-03-03 DIAGNOSIS — Z0001 Encounter for general adult medical examination with abnormal findings: Secondary | ICD-10-CM

## 2024-03-03 LAB — CBC
HCT: 38.5 % (ref 36.0–46.0)
Hemoglobin: 12.4 g/dL (ref 12.0–15.0)
MCHC: 32.2 g/dL (ref 30.0–36.0)
MCV: 86 fl (ref 78.0–100.0)
Platelets: 327 K/uL (ref 150.0–400.0)
RBC: 4.47 Mil/uL (ref 3.87–5.11)
RDW: 15.8 % — ABNORMAL HIGH (ref 11.5–15.5)
WBC: 6.4 K/uL (ref 4.0–10.5)

## 2024-03-03 LAB — LIPID PANEL
Cholesterol: 184 mg/dL (ref 28–200)
HDL: 70.5 mg/dL
LDL Cholesterol: 102 mg/dL — ABNORMAL HIGH (ref 10–99)
NonHDL: 113.63
Total CHOL/HDL Ratio: 3
Triglycerides: 60 mg/dL (ref 10.0–149.0)
VLDL: 12 mg/dL (ref 0.0–40.0)

## 2024-03-03 LAB — BASIC METABOLIC PANEL WITH GFR
BUN: 15 mg/dL (ref 6–23)
CO2: 26 meq/L (ref 19–32)
Calcium: 8.9 mg/dL (ref 8.4–10.5)
Chloride: 104 meq/L (ref 96–112)
Creatinine, Ser: 0.93 mg/dL (ref 0.40–1.20)
GFR: 72.93 mL/min
Glucose, Bld: 94 mg/dL (ref 70–99)
Potassium: 4 meq/L (ref 3.5–5.1)
Sodium: 139 meq/L (ref 135–145)

## 2024-03-03 LAB — MICROALBUMIN / CREATININE URINE RATIO
Creatinine,U: 156.4 mg/dL
Microalb Creat Ratio: UNDETERMINED mg/g (ref 0.0–30.0)
Microalb, Ur: <0.7 mg/dL

## 2024-03-03 LAB — TSH: TSH: 0.8 u[IU]/mL (ref 0.35–5.50)

## 2024-03-03 MED ORDER — ZEPBOUND 2.5 MG/0.5ML ~~LOC~~ SOAJ: 2.5000 mg | SUBCUTANEOUS | 0 refills | Status: DC

## 2024-03-03 MED ORDER — VALACYCLOVIR HCL 1 G PO TABS: 1000.0000 mg | ORAL_TABLET | ORAL | Status: AC | PRN

## 2024-03-03 NOTE — Patient Instructions (Signed)
" °  Schedule cardiology and urology appointment  "

## 2024-03-03 NOTE — Progress Notes (Signed)
 "  Subjective:  Patient ID: Diana Contreras, female    DOB: Apr 18, 1975  Age: 48 y.o. MRN: [id number]  Patient Care Team: Corwin Antu, FNP as PCP - General (Family Medicine) Kate Lonni CROME, MD as PCP - Cardiology (Cardiology)   CC:  Chief Complaint  Patient presents with   Annual Exam    Wants to discuss weight management options other than wegovy   Wants to discuss Birth Control pills    HPI Diana Contreras is a 48 y.o. female who presents today for an annual physical exam. She reports consuming a general diet. The patient does not participate in regular exercise at present. She generally feels well. She reports sleeping well. She does have additional problems to discuss today.   Vision:Within last year Dental:Receives regular dental care STD:The patient denies history of sexually transmitted disease.  Mammogram: 09/19/23 Last pap: 2024 every three years  Colonoscopy: 01/06/23 every ten years   Pt is with acute concerns.   Discussed the use of AI scribe software for clinical note transcription with the patient, who gave verbal consent to proceed.  History of Present Illness Diana Contreras is a 48 year old female who presents for a follow-up regarding weight management and blood pressure monitoring.  She is concerned about her weight management, having previously used Wegovy , which initially helped her lose weight down to 160-165 pounds but caused significant side effects such as cognitive impairment and severe heartburn, leading to its discontinuation. She has since regained 20-25 pounds and is interested in exploring other weight loss options that her insurance might cover.  She has a history of elevated blood pressure. She does not regularly monitor her blood pressure at home but has the capacity to do so. No recent headaches or other symptoms associated with high blood pressure.  In the spring, she experienced a kidney stone, which passed without intervention. She  attributes the stone to increased calcium intake from Tums, which she consumed frequently due to heartburn while on Wegovy . A CT scan confirmed the stone had passed.  She is currently on birth control pills and appreciates the regularity they provide for her menstrual cycle. Her husband had a vasectomy within the past year, and she is considering whether to continue the pills. She reports light periods lasting about two days.    Wt Readings from Last 3 Encounters:  03/03/24 185 lb (83.9 kg)  02/27/23 175 lb (79.4 kg)  02/17/23 177 lb 6.4 oz (80.5 kg)    Advanced Directives Patient does not have advanced directives    DEPRESSION SCREENING    03/03/2024   10:24 AM 02/17/2023    7:45 AM 11/26/2022   10:10 AM  PHQ 2/9 Scores  PHQ - 2 Score 0 0 0  PHQ- 9 Score 0 0  0      Data saved with a previous flowsheet row definition     ROS: Negative unless specifically indicated above in HPI.   Current Medications[1]    Objective:    BP (!) 139/90   Pulse 87   Temp 98 F (36.7 C) (Oral)   Ht 5' 4 (1.626 m)   Wt 185 lb (83.9 kg)   LMP 02/11/2024 (Approximate)   SpO2 98%   BMI 31.76 kg/m   BP Readings from Last 3 Encounters:  03/03/24 (!) 139/90  02/10/24 129/82  02/27/23 (!) 132/90      Physical Exam Constitutional:      General: She is not in acute distress.  Appearance: Normal appearance. She is normal weight. She is not ill-appearing.  HENT:     Head: Normocephalic.     Right Ear: Tympanic membrane normal.     Left Ear: Tympanic membrane normal.     Nose: Nose normal.     Mouth/Throat:     Mouth: Mucous membranes are moist.  Eyes:     Extraocular Movements: Extraocular movements intact.     Pupils: Pupils are equal, round, and reactive to light.  Cardiovascular:     Rate and Rhythm: Normal rate and regular rhythm.  Pulmonary:     Effort: Pulmonary effort is normal.     Breath sounds: Normal breath sounds.  Abdominal:     General: Abdomen is flat. Bowel  sounds are normal.     Palpations: Abdomen is soft.     Tenderness: There is no guarding or rebound.  Musculoskeletal:        General: Normal range of motion.     Cervical back: Normal range of motion.  Skin:    General: Skin is warm.     Capillary Refill: Capillary refill takes less than 2 seconds.  Neurological:     General: No focal deficit present.     Mental Status: She is alert.  Psychiatric:        Mood and Affect: Mood normal.        Behavior: Behavior normal.        Thought Content: Thought content normal.        Judgment: Judgment normal.       Results Radiology Mammogram (09/2022): Normal  Diagnostic Colonoscopy (2024): Normal Echocardiogram (2024): Normal Ambulatory cardiac monitor (Zio) (2024): Normal      Assessment & Plan:   Assessment and Plan Assessment & Plan Woman's Wellness Visit Routine wellness visit with discussion on birth control continuation due to preference for regularity and absence of significant family history of breast cancer. Pap smear up to date, mammogram due in July, and colonoscopy clear last year. No recent STDs, up to date with eye and dental exams. No advanced directive or living will. - Continue current birth control regimen. - Will schedule mammogram in July. - Encouraged regular exercise and healthy diet. - Discussed advanced directive or living will. -Patient Counseling(The following topics were reviewed):  Preventative care handout given to pt  Health maintenance and immunizations reviewed. Please refer to Health maintenance section. Pt advised on safe sex, wearing seatbelts in car, and proper nutrition labwork ordered today for annual Dental health: Discussed importance of regular tooth brushing, flossing, and dental visits.   Class 1 obesity Previous weight loss on Wegovy , discontinued due to side effects and insurance issues. Interested in alternative weight loss medications. Discussed potential options including  Zepbound  and Contrave, considering insurance coverage and side effects. Zepbound  is an injection, similar to Wegovy  but with a different component. Contrave is a pill, but requires blood pressure control before initiation. - Submitted prior authorization for Zepbound . - Monitor insurance coverage for weight loss medications. - Encouraged 150 minutes of exercise per week and 500 calorie deficit diet.  Essential (primary) hypertension Blood pressure slightly elevated today, no recent headaches or migraines. Discussed importance of home monitoring and potential impact of weight loss on blood pressure. Current medication regimen includes Amlodipine . - Check blood pressure at home daily. - Monitor blood pressure trends and report if consistently high. - Will follow up in 3 months if starting weight loss medication.  Sinus arrhythmia Previous cardiology evaluation with normal echocardiogram and  Holter monitor. No recent palpitations or concerning symptoms. - Follow up with cardiology as needed.  History of nephrolithiasis Previous kidney stone episode, passed spontaneously. No recent symptoms. Overdue for urology follow-up. - Schedule follow-up appointment with urology. - Monitor for symptoms of kidney stones.  The beneficiary does not have any FDA labeled contraindications to the requested agent including pregnancy, lactation, h/o medullary thyroid  cancer or multiple endocrine neoplasia type II.       Follow-up: Return in about 3 months (around 06/01/2024) for f/u weight loss medication.   Ginger Patrick, FNP      [1]  Current Outpatient Medications:    amLODipine  (NORVASC ) 10 MG tablet, TAKE 1 TABLET BY MOUTH EVERY DAY, Disp: 90 tablet, Rfl: 0   levonorgestrel -ethinyl estradiol (ALTAVERA) 0.15-30 MG-MCG tablet, Take 1 tablet by mouth daily., Disp: 84 tablet, Rfl: 3   tirzepatide  (ZEPBOUND ) 2.5 MG/0.5ML Pen, Inject 2.5 mg into the skin once a week., Disp: 2 mL, Rfl: 0   valACYclovir   (VALTREX ) 1000 MG tablet, Take 1 tablet (1,000 mg total) by mouth as needed. Take two tablets twice a day prn outbreak, Disp: , Rfl:   "

## 2024-03-05 ENCOUNTER — Ambulatory Visit: Payer: Self-pay | Admitting: Family

## 2024-03-05 ENCOUNTER — Other Ambulatory Visit (HOSPITAL_COMMUNITY): Payer: Self-pay

## 2024-03-05 ENCOUNTER — Other Ambulatory Visit: Payer: Self-pay | Admitting: Family

## 2024-03-05 ENCOUNTER — Telehealth: Payer: Self-pay

## 2024-03-05 DIAGNOSIS — I10 Essential (primary) hypertension: Secondary | ICD-10-CM

## 2024-03-05 DIAGNOSIS — E7800 Pure hypercholesterolemia, unspecified: Secondary | ICD-10-CM

## 2024-03-05 DIAGNOSIS — E6609 Other obesity due to excess calories: Secondary | ICD-10-CM

## 2024-03-05 MED ORDER — ZEPBOUND 2.5 MG/0.5ML ~~LOC~~ SOAJ: 2.5000 mg | SUBCUTANEOUS | 0 refills | Status: DC

## 2024-03-05 NOTE — Telephone Encounter (Signed)
 Can we please do a prior auth for zepbound 

## 2024-03-05 NOTE — Telephone Encounter (Signed)
 Pharmacy Patient Advocate Encounter   Received notification from Physician's Office that prior authorization for Zepbound  2.5 mg/0.5 ml pen is required/requested.   Insurance verification completed.   The patient is insured through [employer].   Per test claim: Per test claim, medication is not covered due to plan/benefit exclusion, PA not submitted at this time  +MUST USE ORLISTAT, QSYMIA, SAXENDA, WEGOVY .

## 2024-03-10 ENCOUNTER — Encounter: Payer: Self-pay | Admitting: Family

## 2024-03-10 NOTE — Telephone Encounter (Signed)
 Looks like the PA was done and Zepbound  is not covered. Says MUST USE ORLISTAT, QSYMIA, SAXENDA, WEGOVY .

## 2024-03-12 NOTE — Telephone Encounter (Signed)
 Is it possible to resubmit with a prior auth that pt failed wegovy  due to s/e?

## 2024-03-15 MED ORDER — LEVONORGESTREL-ETHINYL ESTRAD 0.15-30 MG-MCG PO TABS: 1.0000 | ORAL_TABLET | Freq: Every day | ORAL | 3 refills | Status: AC

## 2024-03-16 ENCOUNTER — Other Ambulatory Visit (HOSPITAL_COMMUNITY): Payer: Self-pay

## 2024-03-16 ENCOUNTER — Telehealth: Payer: Self-pay

## 2024-03-16 DIAGNOSIS — E66811 Obesity, class 1: Secondary | ICD-10-CM

## 2024-03-16 DIAGNOSIS — I10 Essential (primary) hypertension: Secondary | ICD-10-CM

## 2024-03-16 DIAGNOSIS — E7800 Pure hypercholesterolemia, unspecified: Secondary | ICD-10-CM

## 2024-03-16 NOTE — Telephone Encounter (Signed)
 Pharmacy Patient Advocate Encounter   Received notification from Patient Advice Request messages that prior authorization for Zepbound  2.5 is required/requested.   Insurance verification completed.   The patient is insured through [facility].   Per test claim: PA required; PA submitted to above mentioned insurance via Latent Key/confirmation #/EOC [id number] Status is pending

## 2024-03-17 ENCOUNTER — Other Ambulatory Visit (HOSPITAL_COMMUNITY): Payer: Self-pay

## 2024-03-17 NOTE — Telephone Encounter (Signed)
 Pharmacy Patient Advocate Encounter  Received notification from [facility] that Prior Authorization for Zepbound  2.5 has been APPROVED from 03/16/24 to 04/16/24. Ran test claim, Copay is $40.00. This test claim was processed through [facility]- copay amounts may vary at other pharmacies due to pharmacy/plan contracts, or as the patient moves through the different stages of their insurance plan.   PA #/Case ID/Reference #: # [id number]

## 2024-03-19 ENCOUNTER — Telehealth: Payer: Self-pay | Admitting: Family

## 2024-03-19 DIAGNOSIS — E7800 Pure hypercholesterolemia, unspecified: Secondary | ICD-10-CM

## 2024-03-19 DIAGNOSIS — E66811 Obesity, class 1: Secondary | ICD-10-CM

## 2024-03-19 DIAGNOSIS — I10 Essential (primary) hypertension: Secondary | ICD-10-CM

## 2024-03-19 MED ORDER — ZEPBOUND 2.5 MG/0.5ML ~~LOC~~ SOAJ: 2.5000 mg | SUBCUTANEOUS | 0 refills | Status: DC

## 2024-03-19 MED ORDER — ZEPBOUND 2.5 MG/0.5ML ~~LOC~~ SOAJ: 2.5000 mg | SUBCUTANEOUS | 0 refills | Status: AC

## 2024-03-19 NOTE — Telephone Encounter (Signed)
 Prescription has been printed and faxed to [facility].

## 2024-03-19 NOTE — Telephone Encounter (Signed)
 Can we fax the zepbound  order to [facility]? It is not sending electronically I keep getting failure notifications

## 2024-04-02 ENCOUNTER — Other Ambulatory Visit: Payer: Self-pay | Admitting: Family

## 2024-04-02 DIAGNOSIS — R1114 Bilious vomiting: Secondary | ICD-10-CM

## 2024-04-02 MED ORDER — ONDANSETRON HCL 4 MG PO TABS: 4.0000 mg | ORAL_TABLET | Freq: Three times a day (TID) | ORAL | 0 refills | Status: AC | PRN

## 2024-04-12 ENCOUNTER — Other Ambulatory Visit (HOSPITAL_COMMUNITY): Payer: Self-pay
